# Patient Record
Sex: Female | Born: 1935 | Race: White | Hispanic: No | Smoking: Never smoker
Health system: Southern US, Community
[De-identification: ages and names within clinical notes are randomized; demographics above are authoritative.]

## PROBLEM LIST (undated history)

## (undated) DIAGNOSIS — J449 Chronic obstructive pulmonary disease, unspecified: Secondary | ICD-10-CM

## (undated) DIAGNOSIS — I2510 Atherosclerotic heart disease of native coronary artery without angina pectoris: Secondary | ICD-10-CM

## (undated) DIAGNOSIS — Z720 Tobacco use: Secondary | ICD-10-CM

## (undated) DIAGNOSIS — I10 Essential (primary) hypertension: Secondary | ICD-10-CM

## (undated) DIAGNOSIS — I739 Peripheral vascular disease, unspecified: Secondary | ICD-10-CM

## (undated) DIAGNOSIS — E119 Type 2 diabetes mellitus without complications: Secondary | ICD-10-CM

## (undated) DIAGNOSIS — E785 Hyperlipidemia, unspecified: Secondary | ICD-10-CM

## (undated) DIAGNOSIS — F0390 Unspecified dementia without behavioral disturbance: Secondary | ICD-10-CM

## (undated) DIAGNOSIS — I639 Cerebral infarction, unspecified: Secondary | ICD-10-CM

## (undated) HISTORY — DX: Atherosclerotic heart disease of native coronary artery without angina pectoris: I25.10

## (undated) HISTORY — DX: Cerebral infarction, unspecified: I63.9

## (undated) HISTORY — DX: Essential (primary) hypertension: I10

## (undated) HISTORY — DX: Peripheral vascular disease, unspecified: I73.9

## (undated) HISTORY — DX: Tobacco use: Z72.0

## (undated) HISTORY — DX: Hyperlipidemia, unspecified: E78.5

## (undated) HISTORY — DX: Type 2 diabetes mellitus without complications: E11.9

---

## 2004-04-29 ENCOUNTER — Inpatient Hospital Stay (HOSPITAL_BASED_OUTPATIENT_CLINIC_OR_DEPARTMENT_OTHER): Admission: RE | Admit: 2004-04-29 | Discharge: 2004-04-29 | Payer: Self-pay | Admitting: *Deleted

## 2004-05-06 ENCOUNTER — Ambulatory Visit (HOSPITAL_COMMUNITY): Admission: RE | Admit: 2004-05-06 | Discharge: 2004-05-07 | Payer: Self-pay | Admitting: Cardiology

## 2004-08-17 ENCOUNTER — Observation Stay (HOSPITAL_COMMUNITY): Admission: EM | Admit: 2004-08-17 | Discharge: 2004-08-19 | Payer: Self-pay | Admitting: Cardiology

## 2005-03-17 ENCOUNTER — Ambulatory Visit: Payer: Self-pay | Admitting: Cardiology

## 2006-04-24 ENCOUNTER — Ambulatory Visit: Payer: Self-pay | Admitting: Cardiology

## 2007-02-12 ENCOUNTER — Ambulatory Visit: Payer: Self-pay | Admitting: Cardiology

## 2007-05-02 ENCOUNTER — Ambulatory Visit: Payer: Self-pay | Admitting: Cardiology

## 2007-05-11 ENCOUNTER — Encounter: Payer: Self-pay | Admitting: Cardiology

## 2007-05-11 ENCOUNTER — Ambulatory Visit: Payer: Self-pay | Admitting: Cardiology

## 2007-05-23 ENCOUNTER — Ambulatory Visit: Payer: Self-pay | Admitting: Cardiology

## 2007-11-27 ENCOUNTER — Ambulatory Visit: Payer: Self-pay | Admitting: Cardiology

## 2007-12-04 ENCOUNTER — Ambulatory Visit: Payer: Self-pay | Admitting: Cardiology

## 2008-06-04 ENCOUNTER — Ambulatory Visit: Payer: Self-pay | Admitting: Cardiology

## 2008-12-19 ENCOUNTER — Ambulatory Visit: Payer: Self-pay | Admitting: Internal Medicine

## 2008-12-19 ENCOUNTER — Encounter: Payer: Self-pay | Admitting: Cardiology

## 2008-12-19 ENCOUNTER — Inpatient Hospital Stay (HOSPITAL_COMMUNITY): Admission: EM | Admit: 2008-12-19 | Discharge: 2008-12-28 | Payer: Self-pay | Admitting: Internal Medicine

## 2008-12-19 ENCOUNTER — Encounter: Payer: Self-pay | Admitting: Internal Medicine

## 2009-01-07 ENCOUNTER — Encounter: Payer: Self-pay | Admitting: Physician Assistant

## 2009-01-07 ENCOUNTER — Ambulatory Visit: Payer: Self-pay | Admitting: Cardiology

## 2009-01-08 ENCOUNTER — Encounter: Payer: Self-pay | Admitting: Physician Assistant

## 2009-02-12 ENCOUNTER — Ambulatory Visit: Payer: Self-pay | Admitting: Cardiology

## 2009-02-20 ENCOUNTER — Encounter: Payer: Self-pay | Admitting: Cardiology

## 2009-03-19 ENCOUNTER — Ambulatory Visit: Payer: Self-pay | Admitting: *Deleted

## 2009-05-03 ENCOUNTER — Encounter: Payer: Self-pay | Admitting: Cardiology

## 2009-05-04 ENCOUNTER — Ambulatory Visit: Payer: Self-pay | Admitting: Cardiology

## 2009-05-04 ENCOUNTER — Inpatient Hospital Stay (HOSPITAL_COMMUNITY): Admission: AD | Admit: 2009-05-04 | Discharge: 2009-05-05 | Payer: Self-pay | Admitting: Cardiology

## 2009-05-04 ENCOUNTER — Encounter: Payer: Self-pay | Admitting: Cardiology

## 2009-05-05 ENCOUNTER — Encounter: Payer: Self-pay | Admitting: Cardiology

## 2009-05-27 ENCOUNTER — Ambulatory Visit: Payer: Self-pay | Admitting: Cardiology

## 2009-09-10 ENCOUNTER — Ambulatory Visit: Payer: Self-pay | Admitting: Vascular Surgery

## 2009-11-23 ENCOUNTER — Ambulatory Visit: Payer: Self-pay | Admitting: Cardiology

## 2009-11-23 ENCOUNTER — Encounter: Payer: Self-pay | Admitting: Physician Assistant

## 2009-11-24 ENCOUNTER — Encounter: Payer: Self-pay | Admitting: Cardiology

## 2009-11-25 ENCOUNTER — Encounter: Payer: Self-pay | Admitting: Cardiology

## 2010-02-01 ENCOUNTER — Ambulatory Visit: Payer: Self-pay | Admitting: Cardiology

## 2010-02-01 DIAGNOSIS — I2510 Atherosclerotic heart disease of native coronary artery without angina pectoris: Secondary | ICD-10-CM | POA: Insufficient documentation

## 2010-02-01 DIAGNOSIS — J449 Chronic obstructive pulmonary disease, unspecified: Secondary | ICD-10-CM

## 2010-02-01 DIAGNOSIS — E785 Hyperlipidemia, unspecified: Secondary | ICD-10-CM

## 2010-02-01 DIAGNOSIS — I10 Essential (primary) hypertension: Secondary | ICD-10-CM

## 2010-02-01 DIAGNOSIS — I6529 Occlusion and stenosis of unspecified carotid artery: Secondary | ICD-10-CM

## 2010-02-01 DIAGNOSIS — E119 Type 2 diabetes mellitus without complications: Secondary | ICD-10-CM

## 2010-02-01 DIAGNOSIS — J4489 Other specified chronic obstructive pulmonary disease: Secondary | ICD-10-CM | POA: Insufficient documentation

## 2010-02-05 ENCOUNTER — Encounter: Payer: Self-pay | Admitting: Cardiology

## 2010-10-15 ENCOUNTER — Ambulatory Visit: Payer: Self-pay | Admitting: Cardiology

## 2011-01-18 ENCOUNTER — Encounter: Payer: Self-pay | Admitting: Cardiology

## 2011-01-25 NOTE — Cardiovascular Report (Signed)
Summary: Cardiac Catheterization  Cardiac Catheterization   Imported By: Dorise Hiss 10/15/2010 09:47:00  _____________________________________________________________________  External Attachment:    Type:   Image     Comment:   External Document

## 2011-01-25 NOTE — Assessment & Plan Note (Signed)
Summary: 6 mo fu per dec reminder-srs  Medications Added ASPIRIN EC 325 MG TBEC (ASPIRIN) Take one tablet by mouth daily METOPROLOL TARTRATE 25 MG TABS (METOPROLOL TARTRATE) Take 1/2 tablet by mouth twice a day NITROSTAT 0.4 MG SUBL (NITROGLYCERIN) use as directed      Allergies Added: ! PCN ! LIPITOR (ATORVASTATIN)  Visit Type:  Follow-up Primary Provider:  Dr. Donzetta Sprung, MD  CC:  follow-up visit.  History of Present Illness: the patient is a 77 year old female who is her coronary artery disease. Last catheterization was in 2010 she had patent stents and normal LV function.the patient also has significant carotid artery disease. She was hospitalized in November with recurrent atypical chest pain. The patient ruled out for myocardial infarction. Her ejection fraction was stable at 60%.  The patient states that she is doing well. She reports no recurrent chest pain either at rest or on exertion. She has no shortness of breath orthopnea PND. She does have occasional dizzy episodes, with what she calls a blanking episode. This however no fall and no definite loss of consciousness. The patient reports no palpitations.   Preventive Screening-Counseling & Management  Alcohol-Tobacco     Smoking Status: quit     Year Quit: 1985  Current Medications (verified): 1)  Ascriptin 325 Mg Tabs (Aspirin Buf(Alhyd-Mghyd-Cacar)) .... One Tablet By Mouth Daily 2)  Ramipril 5 Mg Caps (Ramipril) .... One Tablet By Mouth Twice A Day 3)  Glipizide 10 Mg Tabs (Glipizide) .... One Tablet By Mouth Daily 4)  Crestor 10 Mg Tabs (Rosuvastatin Calcium) .... One Tablet By Mouth Daily 5)  Celexa 20 Mg Tabs (Citalopram Hydrobromide) .... One Tablet By Mouth Daily 6)  Plavix 75 Mg Tabs (Clopidogrel Bisulfate) .... One Tablet By Mouth Daily 7)  Lasix 20 Mg Tabs (Furosemide) .... One Tablet By Mouth Daily 8)  Metoprolol Tartrate 25 Mg Tabs (Metoprolol Tartrate) .... Take 1/2 Tablet By Mouth Twice A Day 9)   Nitrostat 0.4 Mg Subl (Nitroglycerin) .... Use As Directed  Allergies (verified): 1)  ! Pcn 2)  ! Lipitor (Atorvastatin)  Comments:  Nurse/Medical Assistant: The patient's medications and allergies were reviewed with the patient and were updated in the Medication and Allergy Lists. List reviewed.  Past History:  Past Medical History: Last updated: 02/16/2009 multivessel coronary artery disease status post STEMI/VF cardiac arrestt treated with drug-eluting stenting of proximal LAD secondary to late stent thrombosis. Stage multilple drug- eluting stenting of RCA overall normal LVEF anterior apical akinesis. Relative hypotension improved following discontinuation of Corag peripheral artery disease ankle -- brachial indicis 0.78 right; 0.7 left April 2009 history of hypertension type 2 diabetes mellitus dyslipidemia intolerant to Lipitor remote tobacco history of stroke  Family History: Last updated: 02/16/2009 no pertinent family history  Social History: Last updated: 02/16/2009 Tobacco Use - No.  Alcohol Use - no Drug Use - no  Social History: Smoking Status:  quit  Review of Systems       The patient complains of dizziness.  The patient denies fatigue, malaise, fever, weight gain/loss, vision loss, decreased hearing, hoarseness, chest pain, palpitations, shortness of breath, prolonged cough, wheezing, sleep apnea, coughing up blood, abdominal pain, blood in stool, nausea, vomiting, diarrhea, heartburn, incontinence, blood in urine, muscle weakness, joint pain, leg swelling, rash, skin lesions, headache, fainting, depression, anxiety, enlarged lymph nodes, easy bruising or bleeding, and environmental allergies.    Vital Signs:  Patient profile:   77 year old female Height:      66 inches  Weight:      127 pounds BMI:     20.57 Pulse rate:   73 / minute BP sitting:   118 / 75  (left arm) Cuff size:   regular  Vitals Entered By: Carlye Grippe (February 01, 2010  9:46 AM) CC: follow-up visit   Physical Exam  Additional Exam:  General: Well-developed, well-nourished in no distress head: Normocephalic and atraumatic eyes PERRLA/EOMI intact, conjunctiva and lids normal nose: No deformity or lesions mouth normal dentition, normal posterior pharynx neck: Supple, no JVD.  No masses, thyromegaly or abnormal cervical nodes. Left carotid bruit lungs: Normal breath sounds bilaterally without wheezing.  Normal percussion heart: regular rate and rhythm with normal S1 and S2, no S3 or S4.  PMI is normal.  No pathological murmurs abdomen: Normal bowel sounds, abdomen is soft and nontender without masses, organomegaly or hernias noted.  No hepatosplenomegaly musculoskeletal: Back normal, normal gait muscle strength and tone normal pulsus: Pulse is normal in all 4 extremities Extremities: No peripheral pitting edema neurologic: Alert and oriented x 3 skin: Intact without lesions or rashes cervical nodes: No significant adenopathy psychologic: Normal affect    Impression & Recommendations:  Problem # 1:  CAD (ICD-414.00) the patient has a history of non-ST elevation myocardial infarction completed by ventricular fibrillation and treated with a drug-eluting stent to the proximal LAD secondary to late in-stent thrombosis in January 2009. The patient also had multiple drug-eluting stents in right coronary artery as well as circumflex coronary artery. Her last catheterization was in May of 2010 and demonstrated 50-70% distal circumflex stenosis, patent LAD stents and 50% distal RCA in-stent restenosis. Her ejection fraction was 60%. She reports no recurrent substernal chest pain. During her last admission a couple months ago she ruled out for myocardial infarction. We will continue her current medical regimen. The following medications were removed from the medication list:    Lopressor Hct 100-25 Mg Tabs (Metoprolol-hydrochlorothiazide) ..... One half tablet by  mouth twice a day Her updated medication list for this problem includes:    Aspirin Ec 325 Mg Tbec (Aspirin) .Marland Kitchen... Take one tablet by mouth daily    Ramipril 5 Mg Caps (Ramipril) ..... One tablet by mouth twice a day    Plavix 75 Mg Tabs (Clopidogrel bisulfate) ..... One tablet by mouth daily    Metoprolol Tartrate 25 Mg Tabs (Metoprolol tartrate) .Marland Kitchen... Take 1/2 tablet by mouth twice a day    Nitrostat 0.4 Mg Subl (Nitroglycerin) ..... Use as directed  Problem # 2:  CAROTID ARTERY DISEASE (ICD-433.10) the patient's significant bruit on exam. Her last carotid Doppler's were in February of 2010. The patient will be scheduled for followup Dopplers. Her updated medication list for this problem includes:    Aspirin Ec 325 Mg Tbec (Aspirin) .Marland Kitchen... Take one tablet by mouth daily    Plavix 75 Mg Tabs (Clopidogrel bisulfate) ..... One tablet by mouth daily  Problem # 3:  DYSLIPIDEMIA (ICD-272.4) the patient is intolerant to Lipitor but seems to be tolerating Crestor. Her lipid panels followed by primary care physician. Her updated medication list for this problem includes:    Crestor 10 Mg Tabs (Rosuvastatin calcium) ..... One tablet by mouth daily  Problem # 4:  ESSENTIAL HYPERTENSION, BENIGN (ICD-401.1) blood pressure is well controlled on current medical therapy and have made no changes. The following medications were removed from the medication list:    Lopressor Hct 100-25 Mg Tabs (Metoprolol-hydrochlorothiazide) ..... One half tablet by mouth twice a day Her  updated medication list for this problem includes:    Aspirin Ec 325 Mg Tbec (Aspirin) .Marland Kitchen... Take one tablet by mouth daily    Ramipril 5 Mg Caps (Ramipril) ..... One tablet by mouth twice a day    Lasix 20 Mg Tabs (Furosemide) ..... One tablet by mouth daily    Metoprolol Tartrate 25 Mg Tabs (Metoprolol tartrate) .Marland Kitchen... Take 1/2 tablet by mouth twice a day  Other Orders: Carotid Duplex (Carotid Duplex)  Patient Instructions: 1)  Your  physician has requested that you have a carotid duplex. This test is an ultrasound of the carotid arteries in your neck. It looks at blood flow through these arteries that supply the brain with blood. Allow one hour for this exam. There are no restrictions or special instructions. 2)  Your physician recommends that you continue on your current medications as directed. Please refer to the Current Medication list given to you today. 3)  Your physician wants you to follow-up in: 6 months. You will receive a reminder letter in the mail one-two months in advance. If you don't receive a letter, please call our office to schedule the follow-up appointment.

## 2011-01-25 NOTE — Assessment & Plan Note (Signed)
Summary: 6 MO FU PER SEPT REMINDER   Visit Type:  Follow-up Primary Provider:  Dr. Donzetta Sprung, MD   History of Present Illness: the patient has a history of non-ST elevation myocardial infarction complicated by ventricular fibrillation and treated with a drug-eluting stent to the proximal LAD secondary to late in-stent thrombosis in January 2009. The patient also had multiple drug-eluting stents in right coronary artery as well as circumflex coronary artery. Her last catheterization was in May of 2010 and demonstrated 50-70% distal circumflex stenosis, patent LAD stents and 50% distal RCA in-stent restenosis. Her ejection fraction was 60%. She reports no recurrent substernal chest pain  The patient had carotid dopplers since her last visit. She is a 50-69% stenosis on the right and less than 50% stenosis on the left.  She denies any chest pain shortness of breath orthopnea PND. She has done quite well. Dr. Garner Nash also  performed blood work. She does have some mild hypertension and have asked her daughter to make some readings   Preventive Screening-Counseling & Management  Alcohol-Tobacco     Smoking Status: quit     Year Quit: 1985  Current Medications (verified): 1)  Aspirin Ec 325 Mg Tbec (Aspirin) .... Take One Tablet By Mouth Daily 2)  Ramipril 5 Mg Caps (Ramipril) .... One Tablet By Mouth Twice A Day 3)  Glipizide 10 Mg Tabs (Glipizide) .... One Tablet By Mouth Daily 4)  Crestor 10 Mg Tabs (Rosuvastatin Calcium) .... One Tablet By Mouth Daily 5)  Celexa 20 Mg Tabs (Citalopram Hydrobromide) .... One Tablet By Mouth Daily 6)  Plavix 75 Mg Tabs (Clopidogrel Bisulfate) .... One Tablet By Mouth Daily 7)  Lasix 20 Mg Tabs (Furosemide) .... Take One By Mouth 3 Times Per Week 8)  Metoprolol Tartrate 25 Mg Tabs (Metoprolol Tartrate) .... Take 1/2 Tablet By Mouth Twice A Day 9)  Nitrostat 0.4 Mg Subl (Nitroglycerin) .... Use As Directed  Allergies (verified): 1)  ! Pcn 2)  ! Lipitor  (Atorvastatin)  Comments:  Nurse/Medical Assistant: The patient's medications and allergies were verbally reviewed with the patient's daughter and were updated in the Medication and Allergy Lists.  Past History:  Past Medical History: multivessel coronary artery disease status post STEMI/VF cardiac arrestt treated with drug-eluting stenting of proximal LAD secondary to late stent thrombosis. Stage multilple drug- eluting stenting of RCA overall normal LVEF anterior apical akinesis. Relative hypotension improved following discontinuation of Corag peripheral artery disease ankle -- brachial indicis 0.78 right; 0.7 left April 2009 history of hypertension type 2 diabetes mellitus dyslipidemia intolerant to Lipitor remote tobacco Carotid Dopplers February 2011 50-69% stenosis on the right and 50% stenosis on the left history of stroke  Review of Systems  The patient denies fatigue, malaise, fever, weight gain/loss, vision loss, decreased hearing, hoarseness, chest pain, palpitations, shortness of breath, prolonged cough, wheezing, sleep apnea, coughing up blood, abdominal pain, blood in stool, nausea, vomiting, diarrhea, heartburn, incontinence, blood in urine, muscle weakness, joint pain, leg swelling, rash, skin lesions, headache, fainting, dizziness, depression, anxiety, enlarged lymph nodes, easy bruising or bleeding, and environmental allergies.    Vital Signs:  Patient profile:   77 year old female Height:      66 inches Weight:      133 pounds Pulse rate:   60 / minute BP sitting:   167 / 73  (left arm) Cuff size:   regular  Vitals Entered By: Carlye Grippe (October 15, 2010 2:13 PM)  Physical Exam  Additional Exam:  General: Well-developed, well-nourished in no distress head: Normocephalic and atraumatic eyes PERRLA/EOMI intact, conjunctiva and lids normal nose: No deformity or lesions mouth normal dentition, normal posterior pharynx neck: Supple, no JVD.  No  masses, thyromegaly or abnormal cervical nodes. Left carotid bruit lungs: Normal breath sounds bilaterally without wheezing.  Normal percussion heart: regular rate and rhythm with normal S1 and S2, no S3 or S4.  PMI is normal.  No pathological murmurs abdomen: Normal bowel sounds, abdomen is soft and nontender without masses, organomegaly or hernias noted.  No hepatosplenomegaly musculoskeletal: Back normal, normal gait muscle strength and tone normal pulsus: Pulse is normal in all 4 extremities Extremities: No peripheral pitting edema neurologic: Alert and oriented x 3 skin: Intact without lesions or rashes cervical nodes: No significant adenopathy psychologic: Normal affect    Impression & Recommendations:  Problem # 1:  CAROTID ARTERY DISEASE (ICD-433.10) stable no further intervention required Her updated medication list for this problem includes:    Aspirin Ec 325 Mg Tbec (Aspirin) .Marland Kitchen... Take one tablet by mouth daily    Plavix 75 Mg Tabs (Clopidogrel bisulfate) ..... One tablet by mouth daily  Problem # 2:  CAD (ICD-414.00) History of  severe coronary disease with multiple prior stent placements she'll require lifelong aspirin and Plavix. Her updated medication list for this problem includes:    Aspirin Ec 325 Mg Tbec (Aspirin) .Marland Kitchen... Take one tablet by mouth daily    Ramipril 5 Mg Caps (Ramipril) ..... One tablet by mouth twice a day    Plavix 75 Mg Tabs (Clopidogrel bisulfate) ..... One tablet by mouth daily    Metoprolol Tartrate 25 Mg Tabs (Metoprolol tartrate) .Marland Kitchen... Take 1/2 tablet by mouth twice a day    Nitrostat 0.4 Mg Subl (Nitroglycerin) ..... Use as directed  Problem # 3:  ESSENTIAL HYPERTENSION, BENIGN (ICD-401.1) blood pressure is elevated. She may need increase in her medications and I would suggest to switch Lasix to daily chlorthalidone. However her daughter will first check  blood pressure readings and she will get back in touch with [place] Her updated medication  list for this problem includes:    Aspirin Ec 325 Mg Tbec (Aspirin) .Marland Kitchen... Take one tablet by mouth daily    Ramipril 5 Mg Caps (Ramipril) ..... One tablet by mouth twice a day    Lasix 20 Mg Tabs (Furosemide) .Marland Kitchen... Take one by mouth 3 times per week    Metoprolol Tartrate 25 Mg Tabs (Metoprolol tartrate) .Marland Kitchen... Take 1/2 tablet by mouth twice a day  Patient Instructions: 1)  Your physician recommends that you continue on your current medications as directed. Please refer to the Current Medication list given to you today. 2)  Follow up in  6 months

## 2011-01-25 NOTE — Letter (Signed)
Summary: [facility] D/C DR. Donzetta Sprung  [facility] D/C DR. Donzetta Sprung   Imported By: Zachary George 01/29/2010 15:46:25  _____________________________________________________________________  External Attachment:    Type:   Image     Comment:   External Document

## 2011-01-25 NOTE — Consult Note (Signed)
Summary: CARDIOLOGY CONSULT/ [facility]  CARDIOLOGY CONSULT/ [facility]   Imported By: Zachary George 01/29/2010 15:45:10  _____________________________________________________________________  External Attachment:    Type:   Image     Comment:   External Document

## 2011-02-02 NOTE — Letter (Signed)
Summary: External Correspondence/ [facility] VIS[facility]  External Correspondence/ [facility]   Imported By: Dorise Hiss 01/26/2011 09:52:54  _____________________________________________________________________  External Attachment:    Type:   Image     Comment:   External Document

## 2011-02-24 ENCOUNTER — Encounter: Payer: Self-pay | Admitting: Vascular Surgery

## 2011-02-24 ENCOUNTER — Other Ambulatory Visit: Payer: Self-pay

## 2011-04-05 LAB — CBC
HCT: 38.4 % (ref 36.0–46.0)
MCHC: 33.2 g/dL (ref 30.0–36.0)
Platelets: 149 10*3/uL — ABNORMAL LOW (ref 150–400)
RBC: 4.81 MIL/uL (ref 3.87–5.11)
WBC: 6.3 10*3/uL (ref 4.0–10.5)

## 2011-04-05 LAB — GLUCOSE, CAPILLARY
Glucose-Capillary: 195 mg/dL — ABNORMAL HIGH (ref 70–99)
Glucose-Capillary: 65 mg/dL — ABNORMAL LOW (ref 70–99)

## 2011-04-05 LAB — BASIC METABOLIC PANEL
CO2: 35 mEq/L — ABNORMAL HIGH (ref 19–32)
Chloride: 100 mEq/L (ref 96–112)
Creatinine, Ser: 0.64 mg/dL (ref 0.4–1.2)
Potassium: 3.9 mEq/L (ref 3.5–5.1)
Sodium: 140 mEq/L (ref 135–145)

## 2011-04-05 LAB — PROTIME-INR
INR: 1 (ref 0.00–1.49)
Prothrombin Time: 13.8 seconds (ref 11.6–15.2)

## 2011-04-05 LAB — HEPARIN LEVEL (UNFRACTIONATED)
Heparin Unfractionated: 0.41 IU/mL (ref 0.30–0.70)
Heparin Unfractionated: 0.72 IU/mL — ABNORMAL HIGH (ref 0.30–0.70)

## 2011-04-05 LAB — LIPID PANEL: VLDL: 16 mg/dL (ref 0–40)

## 2011-04-11 LAB — GLUCOSE, CAPILLARY
Glucose-Capillary: 124 mg/dL — ABNORMAL HIGH (ref 70–99)
Glucose-Capillary: 212 mg/dL — ABNORMAL HIGH (ref 70–99)
Glucose-Capillary: 87 mg/dL (ref 70–99)

## 2011-04-11 LAB — BASIC METABOLIC PANEL
CO2: 39 mEq/L — ABNORMAL HIGH (ref 19–32)
Chloride: 93 mEq/L — ABNORMAL LOW (ref 96–112)
GFR calc Af Amer: >60 mL/min (ref >60)
GFR calc non Af Amer: >60 mL/min (ref >60)
Glucose, Bld: 138 mg/dL — ABNORMAL HIGH (ref 70–99)
Potassium: 3.8 mEq/L (ref 3.5–5.1)
Sodium: 138 mEq/L (ref 135–145)
Sodium: 139 mEq/L (ref 135–145)

## 2011-04-11 LAB — CBC
HCT: 37.5 % (ref 36.0–46.0)
Hemoglobin: 11.9 g/dL — ABNORMAL LOW (ref 12.0–15.0)
RBC: 4.59 MIL/uL (ref 3.87–5.11)
RDW: 13.6 % (ref 11.5–15.5)

## 2011-05-10 NOTE — Procedures (Signed)
CAROTID DUPLEX EXAM   INDICATION:  Carotid disease.   HISTORY:  Diabetes:  Yes.  Cardiac:  MI, CAD.  Hypertension:  Yes.  Smoking:  Previous.  Previous Surgery:  No.  CV History:  History of stroke, currently asymptomatic.  Amaurosis Fugax No, Paresthesias No, Hemiparesis No                                       RIGHT             LEFT  Brachial systolic pressure:         172               158  Brachial Doppler waveforms:         Normal            Normal  Vertebral direction of flow:        Antegrade         Antegrade  DUPLEX VELOCITIES (cm/sec)  CCA peak systolic                   87                142  ECA peak systolic                   118               192  ICA peak systolic                   139               133  ICA end diastolic                   35                31  PLAQUE MORPHOLOGY:                  Mixed             Mixed  PLAQUE AMOUNT:                      Moderate          Moderate  PLAQUE LOCATION:                    ICA/ECA           ICA/ECA/CCA   IMPRESSION:  A 40% to 59% stenosis of the bilateral internal carotid  artery.   ___________________________________________  Di Kindle. Edilia Bo, M.D.   CH/MEDQ  D:  09/10/2009  T:  09/10/2009  Job:  [id number]

## 2011-05-10 NOTE — Cardiovascular Report (Signed)
[name], [name] NO.:  [id number]   MEDICAL RECORD NO.:  [id number]          PATIENT TYPE:  INP   LOCATION:  2914                         FACILITY:  [facility]   PHYSICIAN:  Veverly Fells. Excell Seltzer, MD  DATE OF BIRTH:  October 06, 1933   DATE OF PROCEDURE:  12/22/2008  DATE OF DISCHARGE:                            CARDIAC CATHETERIZATION   PROCEDURE:  Percutaneous transluminal coronary angioplasty and stenting  of the mid and distal right coronary artery.   INDICATIONS:  [name] is a 77 year old woman who presented with a  cardiac arrest.  She developed substernal chest pain and ventricular  fibrillation on December 25.  She was found to have critical restenosis  in the mid LAD which was considered her culprit vessel.  At the time of  her initial PCI procedure, she was noted to have severe stenosis in the  proximal to mid right coronary artery and was referred today for staged  PCI.   Risks and indications of the procedure were reviewed with the patient  and informed consent was obtained.  The right groin was prepped, draped,  and anesthetized with 1% lidocaine.  Using modified Seldinger technique,  a 6-French sheath was placed in the right femoral artery.  Angiomax was  started for anticoagulation.  The patient had been adequately preloaded  with clopidogrel 600 mg the day of her initial procedure and then had  received 75 mg daily.  Once a therapeutic ACT was achieved, a 6-French  JR-4 guide catheter was inserted into the right coronary artery.  This  demonstrated severe calcific stenosis at the junction of the proximal to  mid right coronary artery with a 90% lesion.  There was a tandem  moderate lesion beyond that area.  Further down in the vessel at the  junction of the mid to distal right coronary artery, there was another  70% lesion.  I had initially planned on treating the severe stenosis  with PCI and leaving the other lesions for medical therapy.  A Cougar  guidewire was advanced easily into the distal vessel beyond the area of  disease.  The vessel was predilated with a 2.5 x 15 mm Sprinter balloon  which was taken to 8 atmospheres.  Following predilatation, I elected to  stent the severe lesion with a 3.0 x 18 mm Endeavor stent.  The stent  was carefully positioned and deployed at 12 atmospheres.  It appeared  well expanded.  The result was good, but the tandem lesion appeared  severely stenosed beyond the stented segment.  Some of this was clearly  due to wire bias.  Therefore, I gave intracoronary nitroglycerin  following post-dilatation of the original stent.  The stent was post-  dilated with a 3.0 x 15 mm Voyager [place] balloon which was taken to 14  atmospheres originally.  After intracoronary nitroglycerin, the wire was  pulled back to eliminate wire bias.  Angiography demonstrated  significant disease of the distal end of the stent and the other lesion  at the junction of the mid to distal vessel also appeared more  significant following nitroglycerin.  I elected to treat both of these  areas.  Attention was first turned to the distal lesion and a 2.5 x 18  mm Endeavor stent was positioned and deployed at 12 atmospheres.  The  stent appeared well expanded.  Attention was then turned to the mid  lesion just off the distal end of the original stent.  A 3.0 x 12 mm  Endeavor stent was positioned and deployed also at 12 atmospheres.  Following stenting of the mid and distal vessel, there was an excellent  angiographic result.  The mid segment was post-dilated with the same 3.0  x 15 mm Voyager [place] balloon which was taken to 16 atmospheres on 2  inflations over the overlapped area into the distal stent.  The most  distal stent was then post-dilated with a 2.75 x 15-mm Voyager [place]  balloon which was taken to 16 atmospheres.  At the completion of the  procedure, there was an excellent angiographic result.  All stents  appeared well deployed.   There was TIMI III flow in the vessel.   ASSESSMENT:  Successful percutaneous coronary intervention of the right  coronary artery with multiple drug-eluting stents.   PLAN:  Recommend aspirin and Plavix for minimum of 12 months.      Veverly Fells. Excell Seltzer, MD  Electronically Signed     MDC/MEDQ  D:  12/22/2008  T:  12/22/2008  Job:  [id number]

## 2011-05-10 NOTE — H&P (Signed)
NAMELAYZA, SUMMA NO.:  [id number]   MEDICAL RECORD NO.:  [id number]          PATIENT TYPE:  INP   LOCATION:  2915                         FACILITY:  [facility]   PHYSICIAN:  Bevelyn Buckles. Bensimhon, MDDATE OF BIRTH:  08/24/1933   DATE OF ADMISSION:  12/19/2008  DATE OF DISCHARGE:                              HISTORY & PHYSICAL   PRIMARY CARE PHYSICIAN:  Dr. Donzetta Sprung.   Cardiologist is Dr. Lewayne Bunting.   HISTORY OF PRESENT ILLNESS:  Ms. Brierley is a 77 year old woman with a  history of coronary artery disease, diabetes, hypertension and recent  melanoma resection with a nonhealing ulcer on her right lower extremity.  She has a history of coronary artery disease.  She is status post  lateral wall myocardial infarction in 1999 and subsequently underwent  left circumflex stenting.  In May 2005 she had recurrent chest pain.  Cardiac catheterization showed left main was okay. LAD had a high-grade  proximal lesion which was stented by a 2.5x23-mm Cypher drug-eluting  stent. The left circumflex stent was patent. The RCA had a 70% distal  lesion, and the EF was 55%.  She did have a follow-up cath in August  2005, recurrent chest pain.  The LAD stent was widely patent.  There  were multiple scattered lesions in the RCA and the left circumflex  including a 70% distal in-stent restenosis.  She was treated medically.   She has been doing reasonably well.  However, she has had a recent  melanoma resection with nonhealing ulcer on right lower extremity.  Tonight at about 9:00 p.m. she had chest pain.  Apparently she called  EMS around 3:00 a.m. Initial EKG showed some lateral ST elevation.  While in the ambulance she had a VF arrest.  She was intubated and  started on amiodarone, and CPR was initiated. She was diverted to  [facility]. She was revised.  She was stabilized. Code STEMI was  reactivated and transported here. She arrives in our cath lab at about  5:10 a.m.  She was awake intubated.  She was complaining of chest pain.  EKG showed no obvious ST elevation.   REVIEW OF SYSTEMS:  Not available given the fact that she is intubated.   SOCIAL HISTORY:  She is retired.  She lives in [place]. She currently  does not smoke.  She does have a previous history of smoking.  No  alcohol.   FAMILY HISTORY:  Not available.   PAST MEDICAL HISTORY:  1. Coronary artery disease as described above.  2. Peripheral arterial disease.      a.     ABIs in April 2009 were 0.7 in the right and 0.7 on the       left.  3. Hypertension.  4. Diabetes.  5. Hyperlipidemia, intolerant to statins.  6. History of CVA.  7. Chronic dyspnea.   CURRENT MEDICATIONS:  All taste 5 mg a day, amlodipine 5 mg a day,  metoprolol 25 mg b.i.d., Bayer aspirin 81 mg a day, vitamin D, and Zetia  10 mg a day.  She also takes  glipizide 10 mg a day.   ALLERGIES:  To PENICILLIN.   PHYSICAL EXAM:  GENERAL APPEARANCE:  She is intubated but awake.  VITAL SIGNS:  Blood pressure 107/64, heart rate is 65.  HEENT: She has endotracheal tube. She has some foaming around the tube.  NECK: Supple.  There is mild JVD.  Carotids are 1+ bilaterally without  any audible bruits.  There is no lymphadenopathy or thyromegaly.  CHEST:  PMI is not palpable.  She has very distant heart sounds.  She is  regular.  No obvious murmurs.  LUNGS:  Mild rhonchi.  ABDOMEN: Soft, nontender, nondistended.  No hepatosplenomegaly, no  bruits or masses.  Femoral pulses are 2+ bilaterally.  Distal pulses are  nonpalpable.  There is no cyanosis, clubbing or edema.  She does have  1.5x1.5-cm nonhealing ulcer on the right shin.  NEURO:  She is a awake and follows commands.   Labs are pending.   ASSESSMENT:  1. Apparent lateral wall ST elevation and myocardial infarction      complicated by ventricular fibrillation arrest.  2. Ventilatory dependent respiratory failure.  3. Diabetes.  4. Coronary arteries disease as  described above status post previous      left anterior descending artery and circumflex stents.  5. Hyperlipidemia, intolerant of statins.   PLAN/DISPOSITION:  She is undergoing emergent cardiac catheterization by  Dr. Katrinka Blazing.  Plans are pending the results of her catheterization.  Pulmonary critical care team is aware.      Bevelyn Buckles. Bensimhon, MD  Electronically Signed     DRB/MEDQ  D:  12/19/2008  T:  12/19/2008  Job:  [id number]

## 2011-05-10 NOTE — Assessment & Plan Note (Signed)
[facility]                          [name]   [name], [name]                       MRN:          [id number]  DATE:05/27/2009                            DOB:          June 27, 1933    REFERRING PHYSICIAN:  Donzetta Sprung   HISTORY OF PRESENT ILLNESS:  The patient is a 77 year old female with a  history of known coronary artery disease status post cardiac arrest and  ventricular fibrillation in the setting of late stent thrombosis of the  LAD last year.  The patient underwent staged intervention to the right  coronary artery.  Recently, the patient was seen on May 04, 2009, in the  hospital for recurrent chest pain.  She was referred for repeat cardiac  catheterization.  The patient was found less than 10% stenosis within  the Xience stent in the proximal left anterior descending artery, 40%  narrowing in the near stent in the mid circumflex, and 60-70% narrowing  in the distal circumflex distal to the stent, 30% narrowing within the  endeavor stent in the proximal right coronary artery, 40% narrowing  within the stent in the mid right coronary artery, and 50% narrowing  within the endeavors stent in the distal right coronary artery.  The LV  function was normal at 60%.  Since that hospital admission, the patient  has had no recurrent substernal chest pain.  She feels like she is doing  well.  She denies any short of breath, orthopnea, or PND.  She also  denies any syncope.  She is compliant with her medical regimen.   MEDICATIONS:  1. Aspirin 325 mg p.o. daily.  2. Ramipril 5 mg p.o. b.i.d.  3. Glipizide 10 mg p.o. daily.  4. Lopressor 25 mg p.o. b.i.d.  5. Crestor 10 mg p.o. daily.  6. Plavix 75 mg p.o. daily.  7. Lasix 20 mg p.o. daily.  8. Celexa 20 mg half tablet p.o. at bedtime.   PHYSICAL EXAMINATION:  VITAL SIGNS:  Blood pressure 109/58, heart rate  59, weight 135 pounds.  NECK:  Normal carotid upstroke with left  carotid bruit.  Normal carotid  upstroke bilaterally.  No thyromegaly.  No nodular thyroid.  LUNGS:  Clear breath sounds bilaterally.  HEART:  Regular rate and rhythm with normal S1 and S2.  No murmurs,  rubs, or gallops.  ABDOMEN:  Soft, nontender.  No rebound or guarding.  Good bowel sounds.  EXTREMITIES:  No cyanosis, clubbing, or edema.  NEUROLOGIC:  The patient is alert and oriented, grossly nonfocal.   PROBLEM LIST:  1. Multivessel coronary artery disease.      a.     Status post ST-segment elevation, myocardial       infarction/ventricular fibrillation.  Treated with drug-eluting       stents of the proximal LAD and right coronary artery.  2. Multi drug-eluting stenting of the right coronary artery.  3. Overall normal left ventricular function.  4. Anterior apical hypokinesis.      a.     Normal left ventricular function.  5. Peripheral vascular disease.  a.     Ankle and brachial index 0.78 on the right and 0.7 on the       left.      b.     50-70% stenosis in both carotid arteries.  6. History of hypertension.  7. Type 2 diabetes mellitus.  8. Dyslipidemia, intolerance to LIPITOR.  9. Remote tobacco use.  10.History of stroke.   PLAN:  1. The patient is doing well from a cardiac standpoint.  She will need      to stay on lifelong aspirin and Plavix.  2. The patient will need followup with carotid Dopplers during the      next clinic visit.  3. The patient's cholesterol is being checked by a primary care      physician, Dr. Reuel Boom and we will make no changes.  4. I did advise the patient to go up to a normal dose of Celexa which      is 20 mg at bedtime.     Learta Codding, MD,FACC  Electronically Signed    GED/MedQ  DD: 05/27/2009  DT: 05/28/2009  Job #: [id number]   cc:   Donzetta Sprung

## 2011-05-10 NOTE — Assessment & Plan Note (Signed)
[facility]                          [name]   [name], [name]                       MRN:          [id number]  DATE:06/04/2008                            DOB:          1933/11/23    PRIMARY CARDIOLOGIST:  Learta Codding, MD, [facility]   PRIMARY CARE PHYSICIAN:  Dr. Donzetta Sprung.   REASON FOR VISIT:  Six-month followup.   HISTORY OF PRESENT ILLNESS:  [name] is a very pleasant 77 year old  female patient with a history of CAD, status post myocardial infarction  in 1999 and subsequent stenting with a Cypher stent to the LAD in May  2005, who presents to the office today for followup.  Overall, she is  doing well.  She did have her Norvasc changed to 5 mg daily at her last  visit.  She has noted increasing ankle edema since that time.  She has  another lesion on her leg that was suspicious for melanoma.  She is  followed by Dr. Cleotis Nipper.  Apparently, she was told that this would not  be removed unless it changed due to arterial insufficiency.  She did  have ABIs performed that were 0.78 on the right and 0.79 on the left.  She denies any significant leg pain.  She has no evidence of nonhealing  ulcers on her legs or feet.  She denies any chest discomfort.  She does  have some shortness breath with exertion.  She describes NYHA class II  symptoms.  She denies orthopnea, PND, or pedal edema.  She denies  significant cough or wheezing.  She denies syncope.   CURRENT MEDICATIONS:  1. Glipizide 10 mg daily.  2. Metoprolol 25 mg b.i.d.  3. Altace 2.5 mg b.i.d.  4. Zetia 10 mg 3 times a week.  5. Norvasc 5 mg daily.  6. Aspirin 81 mg daily.  7. Nitroglycerin p.r.n. chest pain.  8. Combivent p.r.n.   PHYSICAL EXAM:  GENERAL:  She is a well-nourished, well-developed  female.  VITAL SIGNS:  Blood pressure 135/75, pulse 60, weight 146.6 pounds.  HEENT:  Normal.  NECK:  Without JVD.  LYMPH:  Without lymphadenopathy.  CARDIAC:   Normal S1 and S2.  Regular rate and rhythm.  No murmur.  LUNGS:  Clear to auscultation bilaterally.  No wheezing.  No rhonchi.  No rales.  ABDOMEN:  Soft and nontender with normoactive bowel sounds.  No  organomegaly.  EXTREMITIES:  With 2+ ankle edema bilaterally.  NEUROLOGIC:  She is alert and oriented x3.  Cranial nerves II-XII  grossly intact.  VASCULAR:  No carotid artery bruits noted bilaterally.  Dorsalis pedis  pulses are 1+ bilaterally.  SKIN:  Warm and dry.   Electrocardiogram reveals sinus rhythm.  The heart rate is 62.  Normal  axis.  No acute changes.   IMPRESSION:  1. Coronary artery disease.      a.     Nonischemic Cardiolite, May 2008, with an ejection fraction       of 71%.      b.     Status post Cypher stent to  the left anterior descending,       May 2005.      c.     Cardiac catheterization, August 2005 - patent left anterior       descending stent, nonobstructive circumflex stent, and 70-80%       posterior lateral branch stenosis.      d.     History of myocardial infarction with stenting to the       circumflex 1999.  2. Peripheral arterial disease.      a.     Ankle-brachial indices, April 2009; 0.78 on the right, 0.7       on the left.  3. Pedal edema.      a.     Secondary to Norvasc.  4. Hypertension.  5. Diabetes mellitus.  6. Dyslipidemia.      a.     Intolerant to statins.  7. History of cerebrovascular accident.  8. Ex-smoker.  9. Dyspnea.   PLAN:  1. [name] presents for followup.  She has had increasing pedal      edema since increasing the Norvasc dose.  I have asked to cut this      back to 2.5 mg daily.  She is to notify us if she has no resolution      in her edema.  2. She has had some dyspnea.  She does have a significant past history      of smoking.  She does have COPD and she does have evidence of      hyperinflation on her previous chest x-ray.  She is to follow up      further with Dr. Reuel Boom for this.  3. The patient has a  goal blood pressure of less than 130/80 given her      diabetes mellitus.  Since we are decreasing her Norvasc, I have      asked her to increase her Altace to 5 mg twice a day.  She should      monitor blood pressures at home.  If she has low blood pressures or      side effects to this, she should decrease back to 2.5 mg twice a      day and follow up further with Dr. Reuel Boom.  4. We will recheck her B-MET in 1 week's time to follow up on renal      function and potassium.  At that time we will check a BNP given her      history of dyspnea and ankle edema.  If her BNP is significantly      abnormal, we will need to arrange an echocardiogram to ensure that      she continues to have normal LV function.  5. The patient has abnormal ankle-brachial indices and evidence of      peripheral arterial disease.  She has no significant leg pain, and      she has no evidence of nonhealing ulcers.  No further workup is      warranted at this time.  I discussed this with the patient and her      daughter today.  6. The patient will follow up in 6 months or sooner p.r.n.      Tereso Newcomer, PA-C  Electronically Signed      Learta Codding, MD,FACC  Electronically Signed   SW/MedQ  DD: 06/04/2008  DT: 06/05/2008  Job #: [id number]   cc:   Donzetta Sprung

## 2011-05-10 NOTE — Consult Note (Signed)
VASCULAR SURGERY CONSULTATION   [name], [name]  DOB:  1933/04/26                                       03/19/2009  [id number]#:[id number]   PRIMARY CARE PHYSICIAN:  Donzetta Sprung, MD.   REFERRAL DIAGNOSIS:  Bilateral carotid stenosis.   HISTORY:  The patient is a 77 year old female with a history of known  coronary artery disease who has suffered a cardiac arrest associated  with ventricular fibrillation.   She has a history of carotid bruit.  Carotid duplex exam carried out  February 20, 2009, reveals moderate bilateral internal carotid artery  stenosis in the 40% to 60% range.   The patient has a remote history of nondisabling stroke.  Denies recent  visual disturbance, sensory or motor deficit.  No gait abnormality.  No  speech problems.   Risk factors for cerebrovascular disease include coronary artery  disease, type 2 diabetes, hypertension and hyperlipidemia.   PAST MEDICAL HISTORY:  1. Type 2 diabetes.  2. Hypertension.  3. Coronary artery disease status post percutaneous intervention.  4. Hyperlipidemia.  5. COPD.  6. Peripheral vascular disease.  7. Melanoma.   MEDICATIONS:  1. ASA 325 mg daily.  2. Altace 5 mg 2 times daily.  3. Glipizide 10 mg daily.  4. Metoprolol 12.5 mg 2 times daily.  5. Crestor 10 mg daily.  6. Celexa 20 mg daily.  7. Plavix 75 mg daily.  8. Lasix 20 mg daily.  9. Nitroglycerin p.r.n.  10.Vitamin D.   ALLERGIES:  Penicillin and Lipitor.   SOCIAL HISTORY:  The patient is widowed with 3 children.  She is retired  from the [employer].  Does not currently smoke.  Discontinued tobacco  use in 1989.  No regular alcohol use.   FAMILY HISTORY:  Mother deceased, age 56, of cancer.  Father deceased in  an accident.  One brother deceased with a history of coronary artery  disease at age 82.  One sister deceased with a history of leukemia at  25.   REVIEW OF SYSTEMS:  Refer to patient encounter form.  The patient's only  complaint is that of feelings of depression.   PHYSICAL EXAM:  A 77 year old female who appears approximately her  stated age.  Alert and oriented.  No distress.  Vital Signs:  BP 161/82  left arm, 183/77 right arm, pulse is 64 per minute.  HEENT:  Unremarkable.  Neck:  Supple, no thyromegaly or adenopathy.  Chest:  Equal air entry bilaterally, no rales or rhonchi.  Cardiovascular:  Soft  bilateral carotid bruits.  Normal heart sounds without murmurs.  Regular  rate and rhythm.  No gallops or rubs.  Abdomen:  Soft, nontender.  No  guarding or rebound.  Normal bowel sounds without bruits.  No masses or  organomegaly.  Extremities:  Full range of motion intact.  No peripheral  edema.  Neurologic:  Cranial nerves intact.  Strength equal bilaterally.  Reflexes 1+.  Skin:  Intact, without rash or ulceration.   IMPRESSION:  1. Asymptomatic moderate bilateral internal carotid artery stenosis.  2. Coronary artery disease.  3. Hypertension.  4. Hyperlipidemia.  5. Type 2 diabetes.  6. Peripheral vascular disease.  7. Melanoma.   RECOMMENDATIONS:  The patient has asymptomatic bilateral internal  carotid artery stenoses.  Recommend follow-up with serial ultrasound in  6 months.  Continue aspirin and  Plavix.   No intervention for chronic medical conditions at this time.   Balinda Quails, M.D.  Electronically Signed  PGH/MEDQ  D:  03/19/2009  T:  03/20/2009  Job:  [id number]   cc:   Noah Charon, MD,FACC

## 2011-05-10 NOTE — Discharge Summary (Signed)
[name], [name] NO.:  [id number]   MEDICAL RECORD NO.:  [id number]          PATIENT TYPE:  INP   LOCATION:  2028                         FACILITY:  [facility]   PHYSICIAN:  Bevelyn Buckles. Bensimhon, MDDATE OF BIRTH:  Feb 03, 1933   DATE OF ADMISSION:  12/19/2008  DATE OF DISCHARGE:  12/28/2008                               DISCHARGE SUMMARY   PRIMARY CARDIOLOGIST:  Learta Codding, MD, [facility]   PRIMARY CARE PHYSICIAN:  Dr. Donzetta Sprung.   PROCEDURES PERFORMED DURING HOSPITALIZATION:  1. Emergent cardiac catheterization status post VFib arrest, December 19, 2008.      a.     VFib presumably due to spontaneous recannulization of LAD       after late stent thrombosis, successful angioplasty, and       restenting of the proximal LAD with a XIENCE V drug-eluting stent,       moderate in-stent restenosis in the circumflex stent, new high-       grade proximal right coronary artery stenosis, left ventricular       dysfunction with anterior apical akinesis that will likely       improve.  Overall, LV function is normal, LVDEP is normal per Dr.       Verdis Prime.  2. PTCA of the right coronary artery per Dr. Tonny Bollman on      December 22, 2008, with three overlapping endeavor drug-eluting      stents to the right coronary artery.   FINAL DISCHARGE DIAGNOSES:  1. Status post ventricular fibrillation arrest in the setting of known      coronary artery disease.      a.     ST elevated myocardial infarction.      b.     Status post spontaneous recannulization of left anterior       descending (coronary artery) after late stent thrombosis with new       high-grade proximal right coronary artery stenosis per cardiac       catheterization on December 19, 2008.  2. Ventilator dependent respiratory failure.  3. Peripheral arterial disease.      a.     ABIs April 2009 were 0.7 in the right and 0.7 on the left.  4. Hypertension.  5. Diabetes.  6. Hyperlipidemia, intolerant  to STATINS.  7. History of cerebrovascular accident.  8. Chronic dyspnea.   HOSPITAL COURSE:  This 77 year old Caucasian female with a known history  of coronary artery disease, diabetes, hypertension who had a lateral  myocardial infarction in 1999 with a stent to the left circumflex.  The  patient had recurrent chest pain in May 2005 and had high-grade proximal  lesion in LAD, which was stented at that time.  The patient had been  doing well; however, she had recent melanoma resection and a nonhealing  ulcer on the right lower extremity.  The night of admission, the patient  had substernal chest pain around 9 p.m., she called EMS around 3 a.m.  EKG showed lateral ST elevation and while in transport to hospital, the  patient was then had a VFib arrest.  She was intubated and started on  amiodarone along with CPR being initiated.  She was diverted to [facility] where she was revised and stabilized.  Then, a code STEMI was  activated and the patient was brought to the cardiac catheterization lab  for emergent catheterization.   The patient was seen emergently by Dr. Verdis Prime, cardiologist on-  call, and did undergo a cardiac catheterization with results as  described above.  Please see Dr. Michaelle Copas thorough cardiac  catheterization note for more details.  It was found that the patient  had moderate in-stent restenosis in the circumflex.  She had successful  angioplasty and restenting of the proximal LAD, and new high-grade  proximal right coronary artery stenosis.  The patient was recovered and  followed in ICU, postprocedure.  Echocardiogram was completed revealing  an EF of 35-40%.  The patient was subsequently extubated the following  day with mild CHF noted for which she was treated with IV Lasix and  began on p.o. Lasix daily.  Post-catheterization, the patient did have  evidence of severe bradycardia with a 7-second pause, which did not  reoccur.   On December 22, 2008, the patient had intervention to the right coronary  artery by Dr. Tonny Bollman with multiple drug-eluting stents to the  right coronary artery using overlapping endeavor drug-eluting stents 3.0  x 18 and 3.0 x 12-mm stents.  Then, the patient had a distal RCA  intervention with using a 2.5 x 18 endeavor drug-eluting stent.   The following day, the patient did have some complaints of mild chest  tightness, but it was transient.  The patient's blood pressure was  slightly hypotensive and Coreg and other antihypertensives were held  during this and the patient was followed closely.  The patient was also  known to have some transient confusion; however, this resolved the  following morning.   The patient did have a CT scan of the head to evaluate for recurrence of  TIA with known history of CVA and was found to have no acute  abnormality.  The patient continued to diurese with an additional dose  of IV Lasix on December 23, 2008, secondary to evidence of mild fluid  overload.  A followup PA and lateral chest x-ray was completed to  evaluate for CHF.   The patient had recurrence of some confusion.  A few days later, it was  believed per Dr. Earmon Phoenix note that it was secondary to hypoxic brain  injury in the setting of a cardiac arrest.  The patient had slow, but  sure improvement, but she did have some removal of catheters and IVs  during her stay in the ICU.  A chest x-ray by Dr. Excell Seltzer revealed no  active disease and there were no rib fractures noted.  She was  subsequently moved to telemetry on December 25, 2008, and case manager  became involved to assist with dressing home health issues.  The  patient's captopril was discontinued and changed to lisinopril, and the  patient's metoprolol and Norvasc continued to be placed on hold with  medication changes as above.   While on telemetry, the patient became more active.  She had no further  complaints of chest pain, but she did  have some mild soreness.  The  patient tolerated medication changes and was not hypotensive any more.  She was followed by cardiac rehab and did ambulate.  The patient was  able  to walk without any complaints of chest pain, although she did have  some mild shortness of breath.   On the day of discharge, the patient was seen and examined by Lenard Galloway, physician assistant and Dr. Rollene Rotunda, and found to be  stable for discharge.  The patient will follow up with Dr. Lewayne Bunting  and primary care physician within the next 2 weeks.  She will also be  followed by the home health for OT/PT along with evaluation of  medications and compliance issues.  The patient will also go home on O2  secondary to desaturation with ambulation into the low 80s.   DISCHARGE LABORATORY DATA:  Hemoglobin 11.9, hematocrit 37.5, white  blood cells 8.6, platelets 274, sodium 139, potassium 3.8, chloride 95,  CO2 38, BUN 18, creatinine 0.75, glucose 138.   Blood pressure on discharge 102/66, heart rate 85, respirations 18,  temperature 95% on 2 liters, temperature 97.5.   DISCHARGE MEDICATIONS:  1. Aspirin 81 mg daily.  2. Ergocalciferol 50,000 units on Saturdays.  3. Zetia 10 mg daily.  4. Glipizide 10 mg daily.  5. Colace 200 mg daily.  6. Plavix 75 mg daily.  7. Lasix 20 mg daily.  8. Lisinopril 2.5 mg daily.  9. Coreg 6.25 mg twice a day.  10.Nitroglycerin 0.4 mg under tongue p.r.n. chest pain.   The patient has been advised to stop taking ramipril 5 mg twice a day,  amlodipine 5 mg, and metoprolol.   ALLERGIES:  The patient is allergic to PENICILLIN.   FOLLOWUP PLANS AND APPOINTMENT:  1. The patient will follow up in Dr. Margarita Mail office in [place] in      approximately 7 days.  2. The patient will follow up with Dr. Donzetta Sprung, the patient will      call to make that follow up appointment.  3. The patient will be followed by home health in [place] for continued      evaluation, medical  compliance, and assistance.   Time spent with the patient to include physician time 45 minutes.      Bettey Mare. Lyman Bishop, NP      Bevelyn Buckles. Bensimhon, MD  Electronically Signed    KML/MEDQ  D:  12/28/2008  T:  12/29/2008  Job:  [id number]   cc:   Donzetta Sprung

## 2011-05-10 NOTE — Assessment & Plan Note (Signed)
[facility]                          [name]   DARE, SPILLMAN                       MRN:          [id number]  DATE:11/27/2007                            DOB:          Mar 25, 1933    PRIMARY CARDIOLOGIST:  Learta Codding, MD.   REASON FOR VISIT:  Scheduled followup.   HISTORY:  Since last seen here in the clinic this past May by Dr.  Andee Lineman, the patient continues to do extremely well from a cardiac  standpoint with no interim development of chest pain.  This is in the  wake of a hospitalization here at [facility] in early May when she  presented with angina pectoris relieved by nitroglycerin, but with  negative serial cardiac markers.  She was placed on low-dose Imdur at 15  mg (since discontinued) and was cleared for an outpatient stress test  which was absent of any definite evidence of ischemia; EF of 71%.   We also down-titrated her Altace when we saw her in the hospital,  secondary to significant hypotension.  The patient continues to remain  quite active with no associated exertional chest discomfort or dyspnea.  Of note, no medication adjustments were made when she was last seen here  by Dr. Andee Lineman.   CURRENT MEDICATIONS:  1. Altace 2.5 mg b.i.d.  2. Norvasc 2.5 mg daily.  3. Zetia 10 mg 3 times weekly.  4. Full dose aspirin.  5. Metoprolol 25 b.i.d.  6. Glipizide 10 daily.  7. Nitrostat p.r.n.   PHYSICAL EXAMINATION:  VITAL SIGNS:  Blood pressure 171/79, pulse 58  regular, weight 144.6.  GENERAL:  A 77 year old female sitting upright in no distress.  HEENT:  Normocephalic, atraumatic.  NECK:  Palpable bilateral carotid pulses without bruits.  LUNGS:  Clear to auscultation bilaterally.  HEART:  Regular rate and rhythm (S1 and S2).  No significant murmurs.  No rubs.  ABDOMEN:  Soft, nontender without bruits.  EXTREMITIES:  Palpable bilateral femoral pulses without bruits.  Minimally palpable dorsalis pedis and  posterior tibialis pulses.  NEURO:  Flat affect, but no focal deficit.   IMPRESSION:  1. Severe two-vessel coronary artery disease.      a.     Nonischemic adenosine stress Cardiolite; EF 71%, May 2008.      b.     Status post Cypher stenting left anterior descending artery,       May 2005; widely patent LAD stent, nonobstructive CFX stent, and       70% posterolateral branch stenosis by cardiac catheterization,       August 2005.      c.     Status post lateral myocardial infarction/stent circumflex       artery in 1999.  2. Hypertension.  3. Type 2 diabetes mellitus.  4. Dyslipidemia.  5. History of stroke.  6. Remote tobacco.  7. Asymptomatic sinus bradycardia.   PLAN:  1. Increase Norvasc to 10 mg daily for aggressive blood pressure      control.  2. Follow up blood pressure check here in our clinic in 1 week.  3.  Down-titrate aspirin initially to 162 daily, with goal of 81 mg      daily.  4. Schedule return to clinic to follow up with myself and Dr. Andee Lineman      in 3 months.      Gene Serpe, PA-C  Electronically Signed      Learta Codding, MD,FACC  Electronically Signed   GS/MedQ  DD: 11/27/2007  DT: 11/27/2007  Job #: [id number]   cc:   Donzetta Sprung

## 2011-05-10 NOTE — Assessment & Plan Note (Signed)
[name]                          [name]   [name], [name]                       MRN:          [id number]  DATE:02/12/2009                            DOB:          1933/11/20    REFERRING PHYSICIAN:  Donzetta [name]   HISTORY OF PRESENT ILLNESS:  The patient is a 77 year old female with  known coronary artery disease status post cardiac arrest and ventricular  fibrillation in the setting of a late stent thrombosis of the LAD.  She  also underwent staged intervention to the right coronary artery by [name].  Excell [name].  The patient is doing well currently.  She denies any chest  pain, shortness of breath, orthopnea, or PND.  She is compliant with her  medical regimen.  She knows to be on lifelong aspirin and Plavix.  Interestingly, we heard a left carotid bruit on exam today.   CURRENT MEDICATIONS:  1. Aspirin 325 daily.  2. Enalapril 5 mg p.o. b.i.d.  3. Glipizide 10 mg p.o. daily.  4. Lopressor 25 mg  half a tablet p.o. b.i.d.  5. Crestor 10 mg p.o. daily.  6. Vitamin D 55 units every month.  7. Celexa 20 mg p.o. daily.  8. Plavix 75 mg p.o. daily.  9. Lasix 20 mg p.o. daily.   PHYSICAL EXAMINATION:  VITAL SIGNS:  Blood pressure 156/70, heart rate  65, weight 132 pounds.  HEENT:  Eyes, pupils are clear.  Conjunctivae are clear.  NECK:  Supple.  Normal carotid upstroke with left carotid bruit.  Normal  carotid upstroke bilaterally.  No thyromegaly.  No nodular thyroid.  LUNGS:  Clear breath sounds bilaterally.  HEART:  Regular rate and rhythm.  Normal S1, S2.  No murmurs, rubs, or  gallops.  ABDOMEN:  Soft, nontender.  No rebound or guarding.  Good bowel sounds.  EXTREMITIES:  No cyanosis, clubbing, or edema.  NEUROLOGIC:  The patient is alert, oriented, and grossly nonfocal.   PROBLEM LIST:  1. Multivessel coronary artery disease.      a.     Status post ST-segment elevation myocardial       infarction/ventricular  fibrillation.  Cardiac arrest treated with       drug-eluting stent on proximal left anterior descending coronary       artery secondary to late stent thrombosis.      b.     Multiple drug-eluting stenting of the right carotid artery.      c.     Overall normal left ventricular function.      d.     Anterior apical akinesis.      e.     Relative hypotension.  2. Peripheral arterial disease.  Ankle brachial index 0.78 on the      right and 0.7 on the left, April 2009.  3. History of hypertension.  4. Type 2 diabetes mellitus.  5. Dyslipidemia, intolerant of Lipitor.  6. Remote tobacco use.  7. History of stroke.   PLAN:  1. The patient needs to continue on lifelong aspirin and Plavix.  2. Blood pressure is elevated.  To see if the stated home blood      pressure readings are within normal limits.  3. The patient has a left carotid bruit, and we will schedule her for      carotid Dopplers.  4. The patient can follow up with [place] in 6 months.     [name] Codding, MD,FACC  Electronically Signed    GED/MedQ  DD: 02/12/2009  DT: 02/13/2009  Job #: [id number]   cc:   [name]

## 2011-05-10 NOTE — Assessment & Plan Note (Signed)
[facility]                          [name]   [name], [name]                       MRN:          [id number]  DATE:05/23/2007                            DOB:          03-22-1933    HISTORY OF PRESENT ILLNESS:  The patient is a 77 year old female who was  recently admitted with substernal chest pain. The patient ruled out for  myocardial infarction. Imdur was added to her medical regimen. The  patient stated that she has markedly improved. She has had no recurrent  symptoms. She had a Cardiolite study done in the meanwhile which showed  no ischemia. The patient denies any shortness of breath, orthopnea or  PND.   MEDICATIONS:  1. Altace 2.5 mg p.o. daily.  2. Glipizide 10 mg p.o. daily.  3. Metoprolol 25 mg p.o. daily.  4. Norvasc 5 mg a day.  5. Aspirin 325 daily.  6. Zetia 10 mg a day.  7. Imdur 50 mg p.o. daily.   PHYSICAL EXAMINATION:  VITAL SIGNS:  Blood pressure 144/76, heart rate  66, weight 146 pounds.  NECK:  Normal carotid upstrokes, no carotid bruits.  LUNGS:  Clear breath sounds bilaterally.  HEART:  Regular rate and rhythm. Normal S1, S2. No murmurs, rubs or  gallops.  ABDOMEN:  Soft, nontender, no rebound or guarding. Good bowel sounds.  EXTREMITIES:  No cyanosis, clubbing or edema.   PROBLEM LIST:  1. Status post angina pectoris.      a.     Relieved by nitroglycerin.      b.     Ruled out for myocardial infarction.      c.     Cardiolite study for ischemia.  2. Severe two-vessel coronary artery disease.      a.     History of __________.      b.     CYPHER stent to the LAD May 2005.      c.     Status post patent stents August 2005 by cath.  3. Hypertension.  4. History of stroke.  5. History of hypertension.   PLAN:  1. The patient is hemodynamically stable. She has no recurrent      symptoms. She has no chest pain.  2. The patient can continue on her current medical regimen. The      patient  could have      microvascular disease causing to have chest pain. It is doubtful      that she has significant epicardial coronary artery disease based      on negative Cardiolite study.     Learta Codding, MD,FACC  Electronically Signed    GED/MedQ  DD: 05/23/2007  DT: 05/23/2007  Job #: [id number]

## 2011-05-10 NOTE — Assessment & Plan Note (Signed)
[facility]                          [name]   [name], [name]                       MRN:          [id number]  DATE:01/07/2009                            DOB:          04-26-1933    PRIMARY CARDIOLOGIST:  Learta Codding, MD, [facility]   REASON FOR VISIT:  Post hospital followup.   [name] is a delightful 77 year old female, with known coronary artery  disease, who recently presented to [facility] with cardiac arrest  secondary to ventricular fibrillation.  She was treated with CPR here at  [facility] initially, followed by mechanical ventilation, and  then directed to [facility] for emergent cardiac catheterization, in the  context of ST elevation myocardial infarction.  Dr. Verdis Prime did the  initial procedure and concluded that the VFib arrest was most likely due  to spontaneous recanalization of the LAD, after late stent thrombosis.  He proceeded with successful PTCA and restenting of the proximal LAD,  with a XIENCE V drug-eluting stent.  However, she was also noted to have  high-grade proximal RCA stenosis and moderate, in-stent restenosis of  the circumflex stent site.  Overall, LV function was normal, with  anterior apical akinesis.  Dr. Katrinka Blazing indicated the patient should remain  on aspirin and Plavix, indefinitely.   The patient then returned for staged percutaneous intervention of the  right coronary artery, by Dr. Tonny Bollman, undergoing successful  intervention with multiple, Endeavor drug-eluting stents.  She was  cleared for discharge on December 28, 2008.   Postop course was complicated by development of hypotension, on Coreg,  with temporary cessation.  There was also the issue of transient  confusion, but a followup CT scan of the head was negative.  There was  some suggestion that her symptoms may have been secondary to hypoxic  brain injury.   Of note, the patient was discharged on both ramipril  and lisinopril, in  addition to Coreg 6.25 b.i.d.  One day following discharge, our office  was contacted by home health nursing reporting blood pressures of 60/40  upon standing.  Dr. Andee Lineman ordered that both Coreg and lisinopril be  discontinued.   Subsequent blood pressure readings have been in the low 90-100 systolic  range.  Heart rates have been in the 55-100 range, as well.   Clinically, [name] is doing quite well, following the medication  adjustment.  She denies any presyncope/syncope or falls.  She has not  had any recurrent angina pectoris, which she does recall on the day of  admission.  She also denies any tachy palpitations, PND, orthopnea, or  significant lower extremity edema.   EKG in our office today indicates an SR of 93 bpm.   CURRENT MEDICATIONS:  1. Plavix.  2. Aspirin 81 daily.  3. Glipizide 10 daily.  4. Zetia 10 daily.  5. Lasix 20 daily.  6. Ramipril 5 b.i.d.   PHYSICAL EXAMINATION:  VITAL SIGNS:  Blood pressure 109/70, pulse 92,  regular, weight 130 (down 16 since last visit).  GENERAL:  A 77 year old female sitting upright in no distress.  HEENT:  Normocephalic, atraumatic.  NECK:  Palpable bilateral carotid pulse without bruits; no JVD at 90  degrees.  LUNGS:  Clear to auscultation in all fields.  HEART:  Regular rate and rhythm.  No significant murmurs.  No rubs.  No  S3.  ABDOMEN:  Soft, nontender.  EXTREMITIES:  Right groin stable with minimal ecchymosis, no hematoma,  and soft, bilateral femoral bruits.  Minimally palpable dorsalis pedis  pulses with no edema.  NEUROLOGIC:  No focal deficit.   IMPRESSION:  1. Multivessel coronary artery disease.      a.     Status post STEMI/VF cardiac arrest, treated with drug-       eluting stenting of proximal LAD, secondary to late stent       thrombosis.      b.     Staged, multiple drug-eluting stenting of RCA.      c.     Overall, normal LVF; anterior apical akinesis.  2. Relative  hypotension.      a.     Improved following discontinuation of Coreg.  3. Peripheral arterial disease.      a.     Ankle-brachial indices 0.78 right; 0.7 left, April 2009.  4. History of hypertension.  5. Type 2 diabetes mellitus.  6. Dyslipidemia.      a.     Intolerant to Lipitor.  7. Remote tobacco.  8. History of stroke.   PLAN:  1. Increase aspirin back to full-dose and the patient is to remain on      Plavix indefinitely.  We will subsequently down titrate aspirin to      81 mg daily, pending her clinical course.  2. Follow up BMET with a BNP level.  We will also check a fasting      lipid profile at that time as well, for reassessment of her lipid      status.  3. Coreg will not be resumed.  The patient had significant hypotension      with this medication.  Moreover, she was on metoprolol 25 mg b.i.d.      in the past and does need to be on a beta blocker.  We will      therefore resume this at the low dose of 12.5 mg b.i.d.  She will      return in 1 week for followup blood pressure/pulse check and, if      stable, we will consider up titrating her metoprolol to her      previous dose.  It is also to be noted that the results of the      initial, emergent coronary angiogram indicated normal overall left      ventricular function.  4. Schedule early clinic followup with myself and Dr. Andee Lineman in 1      month, for continued close monitoring.  If the patient remains      hemodynamically stable, then I will urge her to enroll in the      cardiac rehab program.      Rozell Searing, PA-C  Electronically Signed      Learta Codding, MD,FACC  Electronically Signed   GS/MedQ  DD: 01/07/2009  DT: 01/08/2009  Job #: [id number]

## 2011-05-10 NOTE — Cardiovascular Report (Signed)
NAMEAVON, MOLOCK NO.:  [id number]   MEDICAL RECORD NO.:  [id number]          PATIENT TYPE:  INP   LOCATION:  2915                         FACILITY:  [facility]   PHYSICIAN:  Lyn Records, M.D.   DATE OF BIRTH:  Aug 25, 1933   DATE OF PROCEDURE:  12/19/2008  DATE OF DISCHARGE:                            CARDIAC CATHETERIZATION   INDICATIONS FOR PROCEDURE:  V-fib cardiac arrest after developing chest  pain.  Prior history of circumflex and LAD drug-eluting stents.  Most  recently, the LAD implantation in 2005.   PROCEDURE PERFORMED:  1. Left heart catheterization.  2. Selective coronary angiography.  3. Left ventriculography.  4. Intravascular ultrasound.  5. Drug-eluting stent implantation, proximal left anterior descending.   DESCRIPTION:  The patient was transported in from [facility]  where she had undergone successful resuscitation and intubation for a  ventricular fibrillation cardiac arrest.  This occurred after the  patient developed chest pain and had been documented to have ST  elevation on electrocardiographic monitors.   An emergency cardiac catheterization was performed with the patient  being properly ventilated and supported by respiratory therapy team.  A  6-French sheath was placed in the right femoral artery using a modified  Seldinger technique.  A 6-French A2 multipurpose catheter was then used  for hemodynamic recordings, left ventriculography by hand injection, and  selective right coronary angiography.  We used a 3-cm 6-French CLS guide  catheter to obtain diagnostic images of the left coronary.  This  catheter unfortunately would not maintain guide support after wire  advancement into the distal vessel.  We upgraded to a 3.5 6-French CLS  guide catheter and used this to perform intervention.  The patient was  intubated and could not receive Plavix.  We administered the standard  bolus of 0.75 mg/kg of Angiomax followed by 1.75  mg/kg/hour infusion.  ACT was documented to be greater than 300 seconds.  We also gave a  single bolus followed by an infusion of Integrilin.   After crossing the stenosis in the LAD, which was the culprit vessel, we  performed angioplasty with a two 5 x 15-mm long apex balloon.  We then  performed intravascular ultrasound for vessel sizing.  The distal vessel  was less than 2.5 x 2.5 mm in diameter.  The proximal vessel was  eccentric near the stent origin, but was not greater than 2.5 mm in  diameter.  We then deployed a 28-mm long Xience V 2.5-mm diameter stent  to 9 atmospheres.  We postdilated with a Voyager Rhine 2.5 mm x 20 mm long  balloon.  Two balloon inflations each to 15 atmospheres were performed  distally and subsequently proximally.  Following post dilatation, there  was TIMI grade 3 flow noted.  There was good transition from native  vessel to stent without edge dissection or filling defects noted.   Angio-Seal was used for hemostasis.  No complications occurred.   RESULTS:  1. Hemodynamic data:      a.     Aortic pressure 89/46.      b.  Left ventricular pressure 98/9.  2. Left ventriculography:  There is anteroapical akinesis/severe      hypokinesis, although overall LV function is normal with an EF of      50-60% due to hypercontractility of the basal anterior wall and the      inferior wall.  No mitral regurgitation is noted.  3. Coronary angiography.      a.     Left main coronary:  There is ostial left main disease       obstructs the vessel by 30% to no more than 40%.  No catheter       damping is noted.  Calcification is present.      b.     Left anterior descending coronary:  LAD is a large vessel       that is transapical.  It is small in diameter.  Several diagonal       branches arise from this vessel.  The previously placed stent in       the proximal vessel contains hazy mottling throughout the entire       length of the stent with a relatively focal  greater than 80%       stenosis of the proximal stent margin.  It is felt that this is       the likely culprit of the patient's cardiac arrest with a presumed       spontaneous reperfusion after stent thrombosis.  The LAD is       transapical.      c.     Circumflex artery:  This is a large vessel giving origin to       3 obtuse marginal branches.  There is a stent in the mid-to-distal       circumflex proximal to a bifurcation into the second and third       obtuse marginal branches.  The distal stent contains moderate-to-       moderately severe in-stent restenosis graded to be in the 60%       range before the bifurcation.      d.     Right coronary:  The right coronary is a dominant vessel       giving origin to PDA and left ventricular branch.  A new high-       grade proximal stenosis of greater than 90% is noted.  There is       70% stenosis after the PDA origin in the continuation of the right       coronary.  4. PCI:  The LAD lesion as described above contain greater than 80%      stenosis with hazy probable thrombus throughout the entire length      of the previously placed Cypher stent.  After intravascular      ultrasound to document appropriate stent apposition and vessel      size, we deployed a 2.5-mm diameter x 28-mm long Xience V stent to      9 atmospheres and postdilated with a two 5 [place] Voyager with a final      angiographic result that was quite acceptable and no significant      stenosis noted within the stented region.  TIMI grade 3 flow was      noted.  Post deployment IVUS was not performed.   CONCLUSIONS:  1. Cardiac arrest secondary to ventricular fibrillation that occurred      presumably due to spontaneous recanalization of the LAD after late  stent thrombosis.  2. Successful angioplasty and restenting of the proximal LAD with a      Xience V drug-eluting stent.  3. Moderate in-stent restenosis in the circumflex stent.  4. New high-grade proximal RCA  stenosis.  5. Left ventricular dysfunction with anteroapical akinesis that will      likely improve.  Overall LV function is normal and LVEDP is normal.   PLAN:  1. IV hydration.  2. Aspirin and Plavix for life.  3. Integrilin until the patient could be loaded with Plavix.  4. Low-dose bivalirudin for 2 hours postprocedure.  5. Further management per Dr. Markus Daft in [facility].      Lyn Records, M.D.  Electronically Signed     HWS/MEDQ  D:  12/19/2008  T:  12/19/2008  Job:  [id number]   cc:   Arturo Morton. Riley Kill, MD, [facility]

## 2011-05-10 NOTE — Discharge Summary (Signed)
NAMEJHORDYN, [name]                ACCOUNT NO.:  [id number]   MEDICAL RECORD NO.:  [id number]          PATIENT TYPE:  INP   LOCATION:  4707                         FACILITY:  [facility]   PHYSICIAN:  Everardo Beals. Juanda Chance, MD, FACCDATE OF BIRTH:  1933/01/22   DATE OF ADMISSION:  05/04/2009  DATE OF DISCHARGE:  05/05/2009                               DISCHARGE SUMMARY   DISCHARGING PHYSICIAN:  Everardo Beals. Juanda Chance, MD, [facility]   PRIMARY CARDIOLOGIST:  Learta Codding, MD,FACC   PRIMARY CARE PHYSICIAN:  Donzetta Sprung.   DISCHARGING DIAGNOSIS:  Chest pain status post cardiac catheterization  showing patent stents.  Continue current medications.   PAST MEDICAL HISTORY:  1. Coronary artery disease/multivessel with stage multidrug eluting      stent to the right coronary artery and drug-eluting stent to the      proximal LAD.  The patient suffered a non-ST-elevated      MI/ventricular fibrillation cardiac arrest previously.  2. History of hypotension necessitating discontinuation of Coreg.  3. Peripheral arterial disease.  4. Moderate cerebrovascular disease.  5. History of hypertension.  6. Dyslipidemia.  7. Type 2 diabetes.  8. Remote tobacco abuse.  9. History of stroke.  10.History of depression.   PROCEDURES THIS ADMISSION:  Cardiac catheterization on May 05, 2009, by  Dr. Charlies Constable, recommending ongoing medical therapies.  The patient  has 50% restenosis of distal stent in the right coronary artery, normal  ejection fraction.   HOSPITAL COURSE:  [name] is a 77 year old Caucasian female with past  medical history as stated above, previous placement of stents to the  proximal LAD and RCA with a non-STEMI/ventricular fibrillation arrest.  She presented to [facility] complaining of left-sided  chest discomfort with radiation up into her left shoulder.  The patient  stated she had not had any chest discomfort since she suffered out-of-  the hospital ventricular fibrillation  cardiac arrest secondary to late  in-stent thrombosis noted in LAD back in January 2009.  The patient's  daughter felt her mother had developed some exertional dyspnea over the  past 2 weeks and was more fatigued.  Prior to admission, [name] took  nitroglycerin with subsequent prompt release of her discomfort, and in  fact, upon arrival to [facility], she did not have any  further chest discomfort.  Her blood pressure was elevated at 185/92.  EKG showed sinus rhythm without acute ST or T-wave changes, and cardiac  enzymes negative x3 sets.  The patient was seen by Dr. Andee Lineman and Gene  Serpe.   PLAN:  At that time recommendation for the patient to be transferred  directly to [facility] for diagnostic cardiac catheterization and  continued home medications.  The patient remained stable and was taken  to the Cath Lab on the morning of May 05, 2009.  Results as stated  above.  The patient tolerated the procedure without complications and is  being discharged home in stable condition to follow up with Dr. Andee Lineman  on May 27, 2009, at 2:15.  Continue home medications include:  1. Aspirin 325.  2. Altace 5 b.i.d.  3. Glipizide 10 mg daily.  4. Lopressor 12.5 mg b.i.d.  5. Crestor 10.  6. Plavix 75.  7. Lasix 20.  8. Celexa 20.  9. Nitroglycerin p.r.n.   DURATION OF DISCHARGE ENCOUNTER:  Less than 30 minutes.      Dorian Pod, ACNP      Bruce R. Juanda Chance, MD, [facility]  Electronically Signed    MB/MEDQ  D:  05/05/2009  T:  05/06/2009  Job:  [id number]   cc:   Donzetta Sprung

## 2011-05-10 NOTE — Cardiovascular Report (Signed)
[name], [name]                ACCOUNT NO.:  [id number]   MEDICAL RECORD NO.:  [id number]           PATIENT TYPE:   LOCATION:                                 FACILITY:   PHYSICIAN:  Bruce R. Juanda Chance, MD, FACCDATE OF BIRTH:  October 06, 1933   DATE OF PROCEDURE:  05/05/2009  DATE OF DISCHARGE:                            CARDIAC CATHETERIZATION   CLINICAL HISTORY:  Ms. Ditullio is 77 years old and has had multiple prior  percutaneous coronary interventions.  She had bare-metal stent to all 3  vessels.  On Christmas day 2009, she presented with an anterior  infarction with cardiac arrest and had a new XIENCE stent placed by Dr.  Garnette Scheuermann to the LAD.  Three days later, she had 3 Endeavor stents  placed in the right coronary artery for in-stent restenosis.  There were  2 overlapping stents in the proximal and midvessel and distal stent  placed.  She developed an episode of chest pain 2 or 3 days ago and was  seen in consultation by Dr. Andee Lineman and Gene Serpe and transferred to [place]  for evaluation with angiography.   PROCEDURE:  The procedure was performed with the right femoral artery as  an arterial sheath and 5-French preformed coronary catheters.  A front  wall arterial puncture was performed, and Omnipaque contrast was used.  The patient tolerated the procedure well and left the laboratory in  satisfactory condition.   RESULTS:  1. Left main coronary artery:  The left main coronary artery was free      of significant disease.  2. Left anterior descending artery.  The left descending artery gave      rise to 2 diagonal branches and 2 septal perforators.  There was a      long stent in the proximal LAD which was a XIENCE stent placed for      in-stent restenosis from a bare-metal stent, and this had less than      10% narrowing.  3. Circumflex artery.  The circumflex artery gave rise to an atrial      branch, a marginal branch, and then 2 posterolateral branches.      There was a stent  in the mid vessel, and just distal to the stent      there was a 50% narrowing just before the bifurcation into      posterolateral branches.  There was about 40% narrowing within the      stent.  4. Right coronary artery:  The right coronary artery was a moderate-      sized vessel.  It gave rise to a conus branch, a right ventricular      branch, posterior descending branch, and 2 posterolateral branches.      There was 30% narrowing within the Endeavor stent in the proximal      right coronary artery and 40% narrowing within the stent in the      Endeavor stent in the mid right coronary artery.  These were      overlapping stents.  The stented distal right coronary artery which  also has an Endeavor stent had 50% in-stent restenosis.  5. Left ventriculogram:  Left ventriculogram performed in the RAO      projection showed good wall motion with no areas of hypokinesis.      The estimated ejection fraction was 60%.  6. Aortic pressure was 114/40 with a mean of 65.  Left ventricular      pressure was 114/18.   CONCLUSION:  Coronary artery status post multiple percutaneous coronary  interventions with less than 10% stenosis within the XIENCE stent in the  proximal left anterior descending, 40% narrowing within the near stent  in the mid circumflex artery, and 50-70% narrowing in the distal  circumflex artery distal to the stent, 30% narrowing within the Endeavor  stent in the proximal right coronary artery, 40% narrowing within the  stent in the mid right coronary artery, and 50 cm narrowing within the  Endeavor stent in the distal right coronary artery, and normal left  ventricular function with an estimated ejection fraction of 60%.   RECOMMENDATIONS:  The patient appears to have only nonobstructive  disease.  It is a little concerning that there is 50% narrowing within  the stent in the distal right coronary artery at 5 months, but I do not  think it is bad enough to cause  symptoms to need treatment at the  present time.  We will plan reassurance and continued medical therapy.  We probably can discharge her later today.      Bruce Elvera Lennox Juanda Chance, MD, [facility]  Electronically Signed     BRB/MEDQ  D:  05/05/2009  T:  05/05/2009  Job:  [id number]   cc:   Noah Charon, MD,FACC

## 2011-05-13 NOTE — Assessment & Plan Note (Signed)
[facility]                          [name]   TACHA, MANNI                       MRN:          [id number]  DATE:02/12/2007                            DOB:          [birth year]    HISTORY OF PRESENT ILLNESS:  The patient is a 77 year old female who is  here for coronary artery disease status post Cypher stent.  The patient  is status post a recent melanoma surgery and complicated by a staph  infection.  The patient required a  prolonged hospitalization.  She is  now doing well.  She reports no chest pain, shortness of breath,  orthopnea, PND.  She appears to be stable from a cardiovascular  perspective and EKG today in the office also demonstrating a normal  sinus rhythm with no acute ischemic changes.   MEDICATIONS:  1. Altace 5 mg in the morning and Altace 2.5 in the evening.  2. Glipizide 10 mg daily.  3. Metoprolol 25  b.i.d.  4. Norvasc 5 daily.  5. Aspirin 325 daily.  6. Zetia 10 mg p.o. daily.   PHYSICAL EXAMINATION:  VITAL SIGNS:  Blood pressure is 138/68.  Heart  rate 62.  Weight 144 pounds.  NECK:  No carotid upstroke.  No carotid bruits.  LUNGS:  Breath sounds bilaterally.  HEART:  Regular rate and rhythm with normal S1, S2.  ABDOMEN:  Soft.  EXTREMITIES:  No cyanosis, clubbing, or edema.  NEURO:  The patient is alert, oriented, and grossly nonfocal.   PROBLEMS:  1. Status post melanoma excision.  2. Status post complicated staphylococcal aureus infection.  3. Coronary artery disease.      a.     Status post Cypher stent to the left anterior descending.      b.     Cutting balloon to the circumflex.      c.     Catheterization in August 2005 with 70% stenosis in a       posterolateral branch.      d.     Myocardial infarction, 1999.  4. Normal left ventricular function.  Ejection fraction 5%.  5. Diabetes mellitus.  6. Dyslipidemia.  7. Peripheral vascular disease.   PLAN:  1. The patient is stable  from a cardiovascular perspective.  Her EKG      shows no changes.  2. We will continue her on the current medical regimen, although Zetia      could be potentially stopped.  The patient will need a lipid panel      drawn.  She can have this done with Dr. Reuel Boom.  If not, we will do      it at our next clinic visit.  3. The patient has questioned me whether she still needs to use oxygen      at home, which she has not been using for a long time.  I told her      that this likely can be discontinued but to check with Dr. Reuel Boom      on this issue.  4. The patient can followup with [place] in  6 months.     Learta Codding, MD,FACC  Electronically Signed    GED/MedQ  DD: 02/12/2007  DT: 02/12/2007  Job #: [id number]   cc:   Donzetta Sprung

## 2011-05-13 NOTE — Cardiovascular Report (Signed)
[name], [name]                          ACCOUNT NO.:  [id number]   MEDICAL RECORD NO.:  [id number]                   PATIENT TYPE:  OIB   LOCATION:  6524                                 FACILITY:  [facility]   PHYSICIAN:  Arturo Morton. Riley Kill, M.D. [facility]         DATE OF BIRTH:  May 18, 1933   DATE OF PROCEDURE:  05/06/2004  DATE OF DISCHARGE:  05/07/2004                              CARDIAC CATHETERIZATION   INDICATIONS:  [name] is known to our service.  She is a 77 year old  female who previously underwent stenting of [name] circumflex coronary artery.  Recently she has developed some shortness of breath.  She had a borderline  Cardiolite, but Dr. Andee Lineman recommended cardiac catheterization after  discussion with [name] patient and her daughter.  This was accomplished by Dr.  Vida Roller.  This revealed a high grade stenosis of [name] LAD with some  continued patency of [name] circumflex stent.  There was moderate disease of  [name] right coronary with [name] lesion in [name] proximal portion of [name] posterior  lateral continuation branch of [name] right coronary and also proximal lesion  of about 50-60%.  [name] LAD appeared to be flow-limiting in [name] LAO  projection.  Based on this, percutaneous coronary intervention was  recommended.  Risks, benefits and alternatives were discussed with [name]  patient and her [name] prior to [name] procedure in [name] day cath section.   PROCEDURES:  Percutaneous stenting of [name] left anterior descending artery.   DESCRIPTION OF PROCEDURE:  [name] patient was brought to [name] catheterization  lab and prepped and draped in [name] usual fashion.  Through an anterior  puncture, [name] left femoral artery was easily entered and a 6 French sheath  was placed.  We initially used a JL-4 6 [place] guiding catheter.  Bivalirudin was administered according to protocol.  [name] ACT was checked and  found to be in excess of 300 seconds.  [name] patient had been previously  loaded with Plavix of approximately two  hours earlier.  [name] lesion was  crossed with a 0.014 Hi-Torque Floppy wire.  Based on [name] lesion, we  initially placed a 2.25 x 10 cutting balloon and this was taken up to 6 or 7  atmospheres.  This was subsequently removed and then a 2.5 Quantum Maverick  was utilized across [name] area and inflation was done across [name] lesion.  This  ensured that [name] balloon would fully inflate.  [name] artery was then stented  using a 2.5 x 23 [employer] Cordis Cypher stent.  There was some  difficulty getting [name] stent down [name] artery, but we were gently able to  manipulate [name] stent down [name] vessel.  This was taken up to about 13  atmospheres.  Following this, post dilatation was done using a 2.5 PowerSail  balloon throughout [name] course of [name] stent at fairly high pressure.  An  excellent angiographic result was obtained without side branch loss.  All  catheters were subsequently removed.   Prior to [name] procedure, [name] left anterior descending artery demonstrated  some luminal irregularity proximally.  Particularly, in [name] LAO projection,  there was a high grade stenosis in [name] midst of at least moderate  calcification.  This extended down to near [name] bend.  There was also a  slight filling defect at [name] bend which could be related to a side branch.  [name] stent was layed across these two areas, and an excellent final  angiographic appearance achieved with 0 residual stenosis and good TIMI-3  runoff into [name] distal vessel without any evidence of edge tear.   CONCLUSIONS:  Successful percutaneous stenting of [name] left anterior  descending artery.   ADDENDUM:  At [name] completion of [name] procedure, [name] femoral sheath was  reprepped.  A guide wire was placed into [name] femoral artery and [name] sheath  removed and replaced by [name] Angio-Seal device.  Excellent hemostasis was  achieved by [name] Angio-Seal with continued pulse present.  This was  appropriately dressed and she was taken to [name] holding area in  satisfactory  clinical condition.                                               Arturo Morton. Riley Kill, M.D. [facility]    TDS/MEDQ  D:  05/06/2004  T:  05/07/2004  Job:  [id number]   cc:   Learta Codding, M.D. [facility]  [address]  [place]  [place] [address]  Fax: [phone]   Vida Roller, M.D.  Fax: [phone]

## 2011-05-13 NOTE — Cardiovascular Report (Signed)
[name], [name]                          ACCOUNT NO.:  [id number]   MEDICAL RECORD NO.:  [id number]                   PATIENT TYPE:  INP   LOCATION:  2033                                 FACILITY:  [facility]   PHYSICIAN:  Arturo Morton. Riley Kill, M.D. [facility]         DATE OF BIRTH:  08-31-1933   DATE OF PROCEDURE:  08/18/2004  DATE OF DISCHARGE:                              CARDIAC CATHETERIZATION   INDICATIONS:  [name] is a delightful 77 year old woman who previously has  had stenting of the circumflex coronary artery and the left anterior  descending artery.  She has presented with some recurrent left arm  discomfort.  Because of this, repeat cardiac catheterization was indicated.  She was seen by the [facility] team and referred for repeat catheterization.   PROCEDURE:  1. Left heart catheterization.  2. Selective coronary arteriography.  3. Selective left ventriculography.  4. Subclavian angiography.   DESCRIPTION OF PROCEDURE:  The patient was brought to the catheter lab and  prepped and draped in the usual fashion through an anterior puncture of the  right femoral artery and was easily entered.  A 6 French sheath was then  placed.  Views of the left and right coronary arteries were obtained in  multiple angiographic projections.   A left subclavian angiogram was also performed.  Central aortic and left  ventricular pressures were measured with a pigtail ventriculography  performed in the RAO projection.   I then left the suite and moved to the review area where we compared the old  study and the new study in detail.  At this point, I reviewed the films with  Dr. Olga Millers.  It was our feeling that Adenosine-Cardiolite should be  undertaken to determine ischemia and further decisions based on that  finding.  The patient was taken off the table and into the holding area in  satisfactory clinical condition.   HEMODYNAMIC DATA:  1. Central aorta:  140/70, mean 98.  2. Left  ventricular 147/15.  3. No gradient on pull back across the aortic valve.   ANGIOGRAPHIC DATA:  1. On plain fluoroscopy, there was evidence of previously placed stents in     both the circumflex and the LAD.  There was also calcifications of the     coronaries.  2. The left ventriculogram demonstrates some ectopy.  Overall systolic     function is very vigorous in an excess of 70% on post-PVC beats.  There     is trace mitral regurgitation on at least one beat.  3. The subclavian has mild Luminal irregularity but is patent as is the     internal mammary.  4. The left main is free of critical disease.  5. The left anterior descending artery courses to the apex and is previously     stented.  There is a 23 mm Cypher stent which traverses across two     diagonal branches.  Both  of these are extremely small diagonal branches;     the first of which has perhaps 70% ostial narrowing but is a tiny vessel.     The stented site itself is widely patent without evidence of renarrowing.     There is perhaps 30% narrowing in the LAD just beyond the stented site.     The LAD opens up and with mild Luminal irregularity but no critical     narrowing.  6. The circumflex is previously stented as well.  There is a first marginal     branch that has segmental plaquing of about 50-70% in the proximal     portion leading up to the AV circumflex.  The AV circumflex is previously     stented.  In the distal portion of this area, there is about 70% smooth     narrowing and this crosses over the area of the takeoff of the AV     circumflex and the marginal branch which has about 50% narrowing.  The     actual mean lumen diameter appears to be slightly lower than 2 mm and     therefore could cause ischemia.  7. The right coronary artery is a dominant vessel with a 40% area of     proximal narrowing, 40-50% mid, and 50% mid to distal.  Just after the     takeoff of the posterior descending branch is an area of  focal narrowing     of about 70-75% and there is 70% narrowing in the steep end of the larger     posterolateral branch after the takeoff of the smaller posterolateral     branch.  Both of these also are slightly less than 2 mm in minimum lumen     diameter.   IMPRESSION:  1. Continued patency of the previously placed stent in the high grade left     anterior descending artery stenosis.  2. Continued patency of the previously placed circumflex stent from 1999     with partial renarrowing in this location.  3. Scattered lesions of the right coronary artery as described above.   DISPOSITION:  Both the circumflex and the right coronary artery have areas  where the minimum lumen diameter is less than 2.  These certainly could be  some focal sources of ischemia and yet the symptoms are somewhat atypical  and not clearly reflective of ischemia.  Furthermore, which is causing the  problem would be difficult to discern.  Neither is perfectly ideal for  percutaneous intervention. In the case of the circumflex, this extends into  the marginal branch and overlaps the takeoff of a very large marginal  branch.  This would probably require drug-eluding stenting within the  previously placed stent and across a takeoff of the large marginal branch.  Also, with regard to just balloon dilatation or cutting balloon dilatation  in the previously placed stent, the patient has a higher risk of just plain  restenosis in this territory.  The posterolateral lesions and the  continuation branch lesions are also not perfectly ideal for stenting.   Based on all these findings, my inclination could be to treat her medically.  We will get Adenosine-Cardiolite scan tomorrow to exclude important ischemia  in the circumflex or posterolateral territories.  If these were to be  markedly abnormal, then consideration could be given to stenting as  described in the above text.  I have called Dr. Donzetta Sprung and talked  to him about  the plans and recommendations.  I will discuss this with the  patient's family.                                               Arturo Morton. Riley Kill, M.D. [facility]    TDS/MEDQ  D:  08/18/2004  T:  08/19/2004  Job:  [id number]   cc:   Donzetta Sprung, M.D.   Learta Codding, M.D. [facility]   CV Laboratory   Patient's medical record

## 2011-05-13 NOTE — Discharge Summary (Signed)
[name], [name]                          ACCOUNT NO.:  [id number]   MEDICAL RECORD NO.:  [id number]                   PATIENT TYPE:  OIB   LOCATION:  6524                                 FACILITY:  [facility]   PHYSICIAN:  Arturo Morton. [name], M.D. [facility]         DATE OF BIRTH:  1933-10-04   DATE OF ADMISSION:  05/06/2004  DATE OF DISCHARGE:  05/07/2004                                 DISCHARGE SUMMARY   PROCEDURES:  1. Cardiac catheterization.  2. Coronary arteriogram.  3. PTCA with cutting balloon and CYPHER stent to one vessel.   HOSPITAL COURSE:  [name] is a 77 year old female who had shortness of  breath and was evaluated, in the office in [place], on April 20, 2004.  She had  a cardiac catheterization, on Apr 29, 2004, which showed a 70% LAD lesion in  the proximal portion.  She also had a 70% stenosis in the posterior  communicating artery between the posterior lateral branches and distal RCA.  Her EF was 55%.  Her Cardiolite showed anterior ischemia, and so she was  brought back for percutaneous intervention on the LAD.  She was admitted for  this on May 06, 2004.  She was treated with PTCA and a cutting balloon and a  CYPHER stent, reducing the stenosis from 80-90% to 0.  Dr. [name] achieved  an excellent result and her groin was closed with Angioseal.   Her post procedure EKG had no new changes and her post procedure enzymes  were negative.   [name] complained of acute on chronic lower extremity edema and had HCTZ  added to her medication regimen.  She will get a followup B-MET in the  office.  Her diabetes medications are being reviewed by Dr. [name], and he  will determine if Avandia is possibly contributing to her edema and if any  changes need to be made.  She will followup as an outpatient with Dr.  Reuel [name].  Pending evaluation by Dr. [name], [name] is considered stable  for discharge on May 07, 2004.   LABORATORY VALUES:  Hemoglobin 12.1, hematocrit 36.5, WBCs  7.9, platelets  170.  Sodium 139, potassium 3.7, chloride 103, CO2 31, BUN 9, creatinine  0.9, glucose 166.  Post procedure CK-MB 227/3.5.   DISCHARGE CONDITION:  Improved.   DISCHARGE DIAGNOSES:  1. Unstable anginal pain, status post percutaneous transluminal coronary     angioplasty and CYPHER stent to the left anterior descending artery.  2. Status post cardiac catheterization, on Apr 29, 2004, with residual     coronary artery disease in the right coronary artery area and an ejection     fraction of 55% with 1+ MR.  3. Diabetes.  4. Hyperlipidemia.  5. Hypertension.  6. Allergy to PENICILLIN and intolerant of ZOCOR.  7. History of transient ischemic attacks.  8. Peripheral vascular disease.  9. Status post hysterectomy.  10.  Status post myocardial infarction, in 1999, with stent to the     circumflex.  11.      Remote history of tobacco use, 30 pack year history.   DISCHARGE INSTRUCTIONS:  1. Her activity level is to include no driving for 2 days and no strenuous     activity until cleared by M.D.  2. She is to stick to a low fat diabetic diet.  3. She is to call the office for problems with the catheter site.  4. She is to get a B-MET at __________  .  5. She is to see [name]. [name]. for [name], on Friday, May 27th     at 10:45 and get a B-MET at 8:30 a.m. on the same day.  6. She is to followup with [name] as well.   DISCHARGE MEDICATIONS:  1. Plavix 75 mg every day.  2. Nitroglycerin sublingual p.r.n.  3. HCTZ 25 mg one half tab every day.  4. Lipitor 20 mg q.h.s.  5. Metoprolol 100 mg b.i.d.  6. Glipizide 10 mg every day.  7. Altace 5 mg b.i.d.  8. Aspirin 325 mg every day.  9. Avandia 4 mg one half tab every day.      [name], P.A. [facility]                  [name], M.D. [facility]    RB/MEDQ  D:  05/07/2004  T:  05/08/2004  Job:  [id number]   cc:   [facility]  [place], [place]   Donzetta Sprung  [address]  [place]  [place]  [address]  Fax: [phone]

## 2011-05-13 NOTE — Discharge Summary (Signed)
NAMECHINENYE, KATZENBERGER                          ACCOUNT NO.:  [id number]   MEDICAL RECORD NO.:  [id number]                   PATIENT TYPE:  INP   LOCATION:  2033                                 FACILITY:  [facility]   PHYSICIAN:  Learta Codding, M.D. [facility]             DATE OF BIRTH:  17-Jul-1933   DATE OF ADMISSION:  08/17/2004  DATE OF DISCHARGE:  08/19/2004                           DISCHARGE SUMMARY - REFERRING   HISTORY OF PRESENT ILLNESS:  Ms. Ruest is a 77 year old, white female who on  the evening prior to presentation at [facility] on August 23, began  having a dull aching sensation in her left arm into her hand.  She took  three nitroglycerin, but the discomfort did not release and her daughter  brought her to the emergency room.  She denies any associated chest  discomfort, shortness of breath, nausea, vomiting or diaphoresis.  In the  emergency room, her symptoms finally resolved.  Her history is notable for  known coronary artery disease with a myocardial infarction in 1999, stent  placement in 1999, and another one in 2005.  She has a history of non-  insulin dependent diabetes, hypertension, TIAs and hyperlipidemia.   LABORATORY DATA AND X-RAY FINDINGS:  Laboratory data from [facility]  is not on the chart.  At [facility] on August 25, sodium 140,  potassium 4.2, BUN 12, creatinine 0.8, glucose 150, H&H 12.5 and 37.0 with  normal indices.  Platelets 200, WBC 5.8.   Chest x-ray at [facility] showed COPD with no active disease.  EKGs showed  normal sinus rhythm with a rate of 62, normal axis, nonspecific ST and T  wave changes.   HOSPITAL COURSE:  Ms. Glasner was initially admitted to [facility] on  August 23, and seen in consultation by Arnette Felts, P.A. and Dr. Myrtis Ser.  She  was transferred to [facility] for cardiac catheterization to further  evaluate her symptoms given her history.  She was transported on August 17, 2004, by [employer].  Nursing notes  noted that her blood pressure remained  elevated at 200/100.  She was started on Norvasc 5 mg.  Catheterization was  performed by Dr. Riley Kill and this showed native three-vessel coronary artery  disease that ranged from 30-70%.  The stent to the LAD did not have any  restenosis.  The stent to the circumflex did have some 70% restenosis areas.  EF was 70% with slight MR.  Dr. Riley Kill noted there were multiple areas of  potential ischemia.  He recommended an adenosine Cardiolite to further  evaluate potential ischemia.  Carotid Dopplers were also performed on August  24, and this showed a right 40-60% high end of range ICA stenosis and ECA  stenosis.  She had a left 40-60% mid range of ICA stenosis and ECA stenosis.  Vertebral flow was antegrade.  It was noted on August 25, by nursing that  the patient was confused.  It was felt that this may be medication related.  She was assisted back to bed and side rails of her bed were placed in the up  position.  Dopplers of the right groin were also performed to further  evaluate right groin bleed on August 25.  This did not show any evidence of  right groin pseudoaneurysm or AV fistula.  On August 19, 2004, Dr. Andee Lineman  saw the patient and felt that if the adenosine Cardiolite was within normal  limits the patient could be discharged home.  It was felt that she would  need followup with Dr. Reuel Boom and need psychosocial support.  At the time of  discharge, her blood pressure was controlled.   DISCHARGE DIAGNOSES:  1. Progressive coronary artery disease per catheterization, however,     negative adenosine Cardiolite, continue medical treatment.  It is noted     by Dr. Andee Lineman that the stenoses are not favorable for angioplasty.  2. Questionable delusional anxiety or depression.  3. Hypertension with history as previously described.   DISPOSITION:  The patient is discharged home.   DISCHARGE MEDICATIONS:  1. Zocor increased to 20 mg q.h.s.  2.  Altace increased to 5 mg b.i.d.  3. New prescription for Norvasc 5 mg q.d.  4. Aspirin 325 mg q.d.  5. Glipizide 10 mg q.d.  6. Plavix 75 mg q.d.  7. Metoprolol 50 mg b.i.d.  8. Sublingual nitroglycerin as needed.   ACTIVITY:  She was advised no lifting, driving, sexual activity or heavy  exertion x2 days.   DIET:  Maintain low salt, low fat, low cholesterol, ADA diet.   SPECIAL INSTRUCTIONS:  If she has any problems with her catheterization  site, she will call our office.   FOLLOW UP:  She was asked to call Dr. Margarita Mail office in the morning to  arrange a followup 2 week appointment.  She was also asked to arrange  followup with Dr. Reuel Boom.      Joellyn Rued, P.A. [facility]                    Learta Codding, M.D. [facility]    EW/MEDQ  D:  08/19/2004  T:  08/19/2004  Job:  [id number]   cc:   [facility]/Dr. DeGent  [address]  [place], [place] [address]   Donzetta Sprung  [address]  [place]  [place] [address]  Fax: [phone]

## 2011-05-13 NOTE — Cardiovascular Report (Signed)
NAME:  [name], [name]                          ACCOUNT NO.:  [id number]   MEDICAL RECORD NO.:  [id number]                   PATIENT TYPE:  OIB   LOCATION:  NA                                   FACILITY:  [facility]   PHYSICIAN:  Vida Roller, M.D.                DATE OF BIRTH:  25-Apr-1933   DATE OF PROCEDURE:  04/29/2004  DATE OF DISCHARGE:                              CARDIAC CATHETERIZATION   PRIMARY CARE PHYSICIAN:  Donzetta Sprung, M.D., [place], [place].   CARDIOLOGIST:  Learta Codding, M.D.   HISTORY OF PRESENT ILLNESS:  This is a 77 year old woman with known coronary  artery disease, status post stenting to her circumflex coronary artery in  1999 after a lateral wall myocardial infarction.  She presents now with  chest discomfort and has ischemia in the anterior wall.   DETAILS OF PROCEDURE:  After obtaining informed consent, the patient was  brought to the cardiac catheterization laboratory in the fasting state.  There she was prepped and draped in the usual sterile manner and the right  groin was anesthetized using 1% lidocaine without epinephrine.  The right  femoral artery was cannulated using the modified Seldinger technique with a  4 French 10 cm sheath, and left heart catheterization was performed using a  4 French Judkins right #4, a 4 French Judkins left #4, a 4 [place] three-  dimensional right coronary artery, and a 6 French pigtail catheter.  The  three-dimensional right coronary catheter was seated in the right coronary  artery.  The pigtail catheter was used for left ventriculography, which was  performed in the 30 degree RAO view and also distal aortography, which was  performed in the AP view.  At the conclusion of the procedure the catheters  were removed.  The patient was brought back to the cardiology holding area,  where the femoral artery sheath was removed, hemostasis was obtained using  direct manual pressure.  At the conclusion of the hold there was no  evidence  of ecchymosis or hematoma formation.  Distal pulses were intact.   Total fluoroscopic time was 6.9 minutes.  Total iodinized contrast 160 mL.   RESULTS:  Aortic pressure was initially measured at 240/150 but after  aggressive medications was brought down to the 150/60 range.  The left  ventriculogram revealed a pressure of 191/12 with an end-diastolic pressure  of 19 mmHg.  This was when the blood pressure was reasonably high.  There  was no evidence of an aortic gradient.   CORONARY ANGIOGRAPHY:  1. The left main coronary artery is a moderate-caliber vessel which is     angiographically unremarkable.  2. The left circumflex coronary artery is a large vessel which is     nondominant, has multiple branching obtuse marginals.  There is an easily-     seen stent in the midportion of the circumflex coronary artery, which is  widely patent with only minimal in-stent restenosis in its distal     portion.  3. Left anterior descending coronary artery is a large, tortuous vessel     which has a 70% lesion in its proximal portion at the takeoff of a small     diagonal branch.  4. The right coronary artery is a large, dominant vessel with a 70% lesion     in the posterior communicating artery in between the posterolateral     branches and the distal right coronary.  5. Left ventriculogram reveals an ejection fraction of 55% with inferior,     inferobasal hypokinesis and only 1+ MR.  6. The distal aortography reveals normal-sized aorta with widely patent     renal arteries.   ASSESSMENT:  1. This is a woman with severe two-vessel coronary artery disease and     anterior ischemia on myocardial perfusion imaging.  2. Severe hypertension.   PLAN:  Aggressively treat her high blood pressure.  Once her blood pressure  is under control, consideration could be made for percutaneous  revascularization of her left anterior descending coronary artery.                                                Vida Roller, M.D.    JH/MEDQ  D:  04/29/2004  T:  04/30/2004  Job:  [id number]   cc:   Donzetta Sprung  [address]  [place]  [place] [address]  Fax: [phone]   Learta Codding, M.D. [facility]

## 2011-06-09 ENCOUNTER — Encounter: Payer: Self-pay | Admitting: Cardiology

## 2011-07-21 ENCOUNTER — Ambulatory Visit (INDEPENDENT_AMBULATORY_CARE_PROVIDER_SITE_OTHER): Payer: BC Managed Care – PPO | Admitting: Cardiology

## 2011-07-21 ENCOUNTER — Encounter: Payer: Self-pay | Admitting: Cardiology

## 2011-07-21 ENCOUNTER — Encounter (INDEPENDENT_AMBULATORY_CARE_PROVIDER_SITE_OTHER): Payer: BC Managed Care – PPO | Admitting: *Deleted

## 2011-07-21 ENCOUNTER — Telehealth: Payer: Self-pay | Admitting: *Deleted

## 2011-07-21 DIAGNOSIS — I6529 Occlusion and stenosis of unspecified carotid artery: Secondary | ICD-10-CM

## 2011-07-21 DIAGNOSIS — I10 Essential (primary) hypertension: Secondary | ICD-10-CM

## 2011-07-21 DIAGNOSIS — E785 Hyperlipidemia, unspecified: Secondary | ICD-10-CM

## 2011-07-21 DIAGNOSIS — I2510 Atherosclerotic heart disease of native coronary artery without angina pectoris: Secondary | ICD-10-CM

## 2011-07-21 NOTE — Telephone Encounter (Signed)
Patient is in the office now and Dr. Andee Lineman is requesting Cartoid Dopplers. Checking percert.

## 2011-07-21 NOTE — Patient Instructions (Signed)
   Carotid doppler today in office If the results of your test are normal or stable, you will receive a letter.  If they are abnormal, the nurse will contact you by phone. Your physician wants you to follow up in: 6 months.  You will receive a reminder letter in the mail one-two months in advance.  If you don't receive a letter, please call our office to schedule the follow up appointment

## 2011-07-21 NOTE — Telephone Encounter (Signed)
No precert required 

## 2011-07-26 NOTE — Progress Notes (Signed)
HPI The patient is a 77 year old female with a history of numbness elevation microinfarction complicated by ventricular fibrillation and previously treated with drug-eluting stent to the proximal LAD. She had late in-stent thrombosis in January 2009. The patient also had multiple drug-eluting stents in right coronary artery as well as circumflex coronary artery. Her last cardiac catheterization was in May of 2010 and demonstrated 50-70% distal circumflex stenosis, patent LAD stents and 50% distal RCA in-stent restenosis. Ejection fraction is 60%. The patient also is carotid disease with 50-69% stenosis on the right side and less than 50% on the left. His followup by primary care physician in regards to her blood work. Her LDL is apparently below 100. EKG shows no acute changes today. She states is doing well. She denies any chest pain shortness of breath orthopnea PND. She has no palpitations or syncope  Allergies  Allergen Reactions  . Atorvastatin     REACTION: myalgia  . Penicillins     REACTION: rash    Current Outpatient Prescriptions on File Prior to Visit  Medication Sig Dispense Refill  . aspirin 325 MG EC tablet Take 325 mg by mouth daily.        . citalopram (CELEXA) 20 MG tablet Take 20 mg by mouth daily.        . clopidogrel (PLAVIX) 75 MG tablet Take 75 mg by mouth daily.        . furosemide (LASIX) 20 MG tablet Take 20 mg by mouth 3 (three) times a week.        Marland Kitchen glipiZIDE (GLUCOTROL) 10 MG tablet Take 10 mg by mouth daily.        . metoprolol tartrate (LOPRESSOR) 25 MG tablet Take 25 mg by mouth 2 (two) times daily. Take 1/2 tab       . nitroGLYCERIN (NITROSTAT) 0.4 MG SL tablet Place 0.4 mg under the tongue every 5 (five) minutes as needed.        . ramipril (ALTACE) 5 MG capsule Take 5 mg by mouth 2 (two) times daily.        . rosuvastatin (CRESTOR) 10 MG tablet Take 10 mg by mouth daily.          Past Medical History  Diagnosis Date  . Coronary artery disease    multivessel  . Hypotension   . Peripheral artery disease   . Hypertension     hx  . Diabetes mellitus, type 2   . Dyslipidemia   . Tobacco user     remote  . Stroke     No past surgical history on file.  No family history on file.  History   Social History  . Marital Status: Widowed    Spouse Name: N/A    Number of Children: N/A  . Years of Education: N/A   Occupational History  . Not on file.   Social History Main Topics  . Smoking status: Never Smoker   . Smokeless tobacco: Not on file  . Alcohol Use: No  . Drug Use: No  . Sexually Active: Not on file   Other Topics Concern  . Not on file   Social History Narrative  . No narrative on file   [id number] positives as outlined above. The remainder of the 18  point review of systems is negative  PHYSICAL EXAM BP 128/69  Pulse 64  Resp 18  Ht 5\' 6"  (1.676 m)  Wt 132 lb 1.9 oz (59.929 kg)  BMI 21.32 kg/m2  SpO2 90%  General: Well-developed, well-nourished in no distress Head: Normocephalic and atraumatic Eyes:PERRLA/EOMI intact, conjunctiva and lids normal Ears: No deformity or lesions Mouth:normal dentition, normal posterior pharynx Neck: Supple, no JVD.  No masses, thyromegaly or abnormal cervical nodes Lungs: Normal breath sounds bilaterally without wheezing.  Normal percussion Cardiac: regular rate and rhythm with normal S1 and S2, no S3 or S4.  PMI is normal.  No pathological murmurs Abdomen: Normal bowel sounds, abdomen is soft and nontender without masses, organomegaly or hernias noted.  No hepatosplenomegaly MSK: Back normal, normal gait muscle strength and tone normal Vascular: Pulse is normal in all 4 extremities Extremities: No peripheral pitting edema Neurologic: Alert and oriented x 3 Skin: Intact without lesions or rashes Lymphatics: No significant adenopathy Psychologic: Normal affect  ECG: Normal sinus rhythm. No acute changes  ASSESSMENT AND PLAN

## 2011-07-26 NOTE — Assessment & Plan Note (Signed)
Followed by the patient's primary care physician 

## 2011-07-26 NOTE — Assessment & Plan Note (Signed)
Patient will need followup carotid Dopplers. They will be scheduled in the office today

## 2011-07-26 NOTE — Assessment & Plan Note (Signed)
Blood pressure well controlled. No change in medical therapy. 

## 2011-07-26 NOTE — Assessment & Plan Note (Signed)
Patient has multiple prior stents. However she denies any chest pain. She appears to have no evidence of ischemia. Continue medical therapy and risk factor modification

## 2011-09-30 LAB — GLUCOSE, CAPILLARY
Glucose-Capillary: 100 mg/dL — ABNORMAL HIGH (ref 70–99)
Glucose-Capillary: 108 mg/dL — ABNORMAL HIGH (ref 70–99)
Glucose-Capillary: 135 mg/dL — ABNORMAL HIGH (ref 70–99)
Glucose-Capillary: 138 mg/dL — ABNORMAL HIGH (ref 70–99)
Glucose-Capillary: 140 mg/dL — ABNORMAL HIGH (ref 70–99)
Glucose-Capillary: 148 mg/dL — ABNORMAL HIGH (ref 70–99)
Glucose-Capillary: 150 mg/dL — ABNORMAL HIGH (ref 70–99)
Glucose-Capillary: 157 mg/dL — ABNORMAL HIGH (ref 70–99)
Glucose-Capillary: 167 mg/dL — ABNORMAL HIGH (ref 70–99)
Glucose-Capillary: 173 mg/dL — ABNORMAL HIGH (ref 70–99)
Glucose-Capillary: 226 mg/dL — ABNORMAL HIGH (ref 70–99)
Glucose-Capillary: 264 mg/dL — ABNORMAL HIGH (ref 70–99)
Glucose-Capillary: 351 mg/dL — ABNORMAL HIGH (ref 70–99)
Glucose-Capillary: 418 mg/dL — ABNORMAL HIGH (ref 70–99)
Glucose-Capillary: 68 mg/dL — ABNORMAL LOW (ref 70–99)
Glucose-Capillary: 74 mg/dL (ref 70–99)
Glucose-Capillary: 80 mg/dL (ref 70–99)
Glucose-Capillary: 98 mg/dL (ref 70–99)

## 2011-09-30 LAB — CBC
HCT: 33.8 % — ABNORMAL LOW (ref 36.0–46.0)
HCT: 35.6 % — ABNORMAL LOW (ref 36.0–46.0)
HCT: 37.7 % (ref 36.0–46.0)
HCT: 39.6 % (ref 36.0–46.0)
Hemoglobin: 12.1 g/dL (ref 12.0–15.0)
MCHC: 32.3 g/dL (ref 30.0–36.0)
MCHC: 32.6 g/dL (ref 30.0–36.0)
MCHC: 32.6 g/dL (ref 30.0–36.0)
MCV: 80.5 fL (ref 78.0–100.0)
MCV: 80.8 fL (ref 78.0–100.0)
MCV: 80.9 fL (ref 78.0–100.0)
Platelets: 180 10*3/uL (ref 150–400)
Platelets: 209 10*3/uL (ref 150–400)
RBC: 4.19 MIL/uL (ref 3.87–5.11)
RBC: 4.29 MIL/uL (ref 3.87–5.11)
RBC: 4.42 MIL/uL (ref 3.87–5.11)
RBC: 4.66 MIL/uL (ref 3.87–5.11)
RDW: 13.5 % (ref 11.5–15.5)
RDW: 13.7 % (ref 11.5–15.5)
WBC: 10.7 10*3/uL — ABNORMAL HIGH (ref 4.0–10.5)
WBC: 11.1 10*3/uL — ABNORMAL HIGH (ref 4.0–10.5)
WBC: 17.4 10*3/uL — ABNORMAL HIGH (ref 4.0–10.5)
WBC: 25 10*3/uL — ABNORMAL HIGH (ref 4.0–10.5)

## 2011-09-30 LAB — CARDIAC PANEL(CRET KIN+CKTOT+MB+TROPI)
CK, MB: 37.9 ng/mL — ABNORMAL HIGH (ref 0.3–4.0)
Relative Index: 7.9 — ABNORMAL HIGH (ref 0.0–2.5)
Troponin I: 1.63 ng/mL (ref 0.00–0.06)
Troponin I: 3.35 ng/mL (ref 0.00–0.06)
Troponin I: 4.23 ng/mL (ref 0.00–0.06)

## 2011-09-30 LAB — COMPREHENSIVE METABOLIC PANEL
ALT: 23 U/L (ref 0–35)
AST: 37 U/L (ref 0–37)
Albumin: 3.3 g/dL — ABNORMAL LOW (ref 3.5–5.2)
Alkaline Phosphatase: 52 U/L (ref 39–117)
BUN: 8 mg/dL (ref 6–23)
CO2: 27 mEq/L (ref 19–32)
Chloride: 103 mEq/L (ref 96–112)
Chloride: 105 mEq/L (ref 96–112)
Creatinine, Ser: 0.64 mg/dL (ref 0.4–1.2)
GFR calc Af Amer: >60 mL/min (ref >60)
GFR calc non Af Amer: >60 mL/min (ref >60)
Potassium: 4.2 mEq/L (ref 3.5–5.1)
Sodium: 139 mEq/L (ref 135–145)
Total Bilirubin: 1.1 mg/dL (ref 0.3–1.2)
Total Bilirubin: 1.2 mg/dL (ref 0.3–1.2)
Total Protein: 5.8 g/dL — ABNORMAL LOW (ref 6.0–8.3)

## 2011-09-30 LAB — POCT I-STAT, CHEM 8
BUN: 16 mg/dL (ref 6–23)
Creatinine, Ser: 0.7 mg/dL (ref 0.4–1.2)
Glucose, Bld: 418 mg/dL — ABNORMAL HIGH (ref 70–99)
Hemoglobin: 14.3 g/dL (ref 12.0–15.0)
Potassium: 4.1 mEq/L (ref 3.5–5.1)
TCO2: 24 mmol/L (ref 0–100)

## 2011-09-30 LAB — BASIC METABOLIC PANEL
BUN: 17 mg/dL (ref 6–23)
BUN: 7 mg/dL (ref 6–23)
CO2: 36 mEq/L — ABNORMAL HIGH (ref 19–32)
CO2: 37 mEq/L — ABNORMAL HIGH (ref 19–32)
CO2: 38 mEq/L — ABNORMAL HIGH (ref 19–32)
Calcium: 9.6 mg/dL (ref 8.4–10.5)
Calcium: 9.8 mg/dL (ref 8.4–10.5)
Chloride: 88 mEq/L — ABNORMAL LOW (ref 96–112)
Chloride: 93 mEq/L — ABNORMAL LOW (ref 96–112)
Chloride: 98 mEq/L (ref 96–112)
Creatinine, Ser: 0.73 mg/dL (ref 0.4–1.2)
Creatinine, Ser: 0.91 mg/dL (ref 0.4–1.2)
GFR calc Af Amer: >60 mL/min (ref >60)
GFR calc Af Amer: >60 mL/min (ref >60)
GFR calc Af Amer: >60 mL/min (ref >60)
Glucose, Bld: 169 mg/dL — ABNORMAL HIGH (ref 70–99)
Potassium: 3.5 mEq/L (ref 3.5–5.1)
Potassium: 3.6 mEq/L (ref 3.5–5.1)
Potassium: 3.9 mEq/L (ref 3.5–5.1)
Sodium: 134 mEq/L — ABNORMAL LOW (ref 135–145)
Sodium: 137 mEq/L (ref 135–145)

## 2011-09-30 LAB — DIFFERENTIAL
Basophils Absolute: 0 10*3/uL (ref 0.0–0.1)
Basophils Relative: 0 % (ref 0–1)
Basophils Relative: 0 % (ref 0–1)
Eosinophils Absolute: 0 10*3/uL (ref 0.0–0.7)
Eosinophils Relative: 0 % (ref 0–5)
Lymphocytes Relative: 3 % — ABNORMAL LOW (ref 12–46)
Monocytes Relative: 10 % (ref 3–12)
Monocytes Relative: 4 % (ref 3–12)
Neutro Abs: 23.2 10*3/uL — ABNORMAL HIGH (ref 1.7–7.7)
Neutro Abs: 7.6 10*3/uL (ref 1.7–7.7)
Neutrophils Relative %: 71 % (ref 43–77)

## 2011-09-30 LAB — B-NATRIURETIC PEPTIDE (CONVERTED LAB)
Pro B Natriuretic peptide (BNP): 136 pg/mL — ABNORMAL HIGH (ref 0.0–100.0)
Pro B Natriuretic peptide (BNP): 141 pg/mL — ABNORMAL HIGH (ref 0.0–100.0)
Pro B Natriuretic peptide (BNP): 283 pg/mL — ABNORMAL HIGH (ref 0.0–100.0)
Pro B Natriuretic peptide (BNP): 520 pg/mL — ABNORMAL HIGH (ref 0.0–100.0)

## 2011-09-30 LAB — POCT I-STAT 3, ART BLOOD GAS (G3+)
Acid-base deficit: 8 mmol/L — ABNORMAL HIGH (ref 0.0–2.0)
O2 Saturation: 100 %
TCO2: 21 mmol/L (ref 0–100)
pCO2 arterial: 45.9 mmHg — ABNORMAL HIGH (ref 35.0–45.0)
pO2, Arterial: 558 mmHg — ABNORMAL HIGH (ref 80.0–100.0)

## 2011-09-30 LAB — PROTIME-INR: Prothrombin Time: 19.9 seconds — ABNORMAL HIGH (ref 11.6–15.2)

## 2012-01-19 ENCOUNTER — Encounter: Payer: BC Managed Care – PPO | Admitting: *Deleted

## 2012-06-03 DIAGNOSIS — J449 Chronic obstructive pulmonary disease, unspecified: Secondary | ICD-10-CM

## 2012-07-22 DIAGNOSIS — J449 Chronic obstructive pulmonary disease, unspecified: Secondary | ICD-10-CM

## 2012-07-23 DIAGNOSIS — J449 Chronic obstructive pulmonary disease, unspecified: Secondary | ICD-10-CM

## 2012-07-23 DIAGNOSIS — R748 Abnormal levels of other serum enzymes: Secondary | ICD-10-CM

## 2012-07-24 DIAGNOSIS — J449 Chronic obstructive pulmonary disease, unspecified: Secondary | ICD-10-CM

## 2012-07-24 DIAGNOSIS — I214 Non-ST elevation (NSTEMI) myocardial infarction: Secondary | ICD-10-CM

## 2012-07-26 DIAGNOSIS — I214 Non-ST elevation (NSTEMI) myocardial infarction: Secondary | ICD-10-CM

## 2012-07-26 DIAGNOSIS — I2510 Atherosclerotic heart disease of native coronary artery without angina pectoris: Secondary | ICD-10-CM

## 2012-08-16 DIAGNOSIS — R05 Cough: Secondary | ICD-10-CM

## 2012-12-06 ENCOUNTER — Encounter: Payer: Self-pay | Admitting: Vascular Surgery

## 2013-06-28 DIAGNOSIS — I214 Non-ST elevation (NSTEMI) myocardial infarction: Secondary | ICD-10-CM

## 2013-06-28 DIAGNOSIS — I2510 Atherosclerotic heart disease of native coronary artery without angina pectoris: Secondary | ICD-10-CM

## 2013-06-28 NOTE — H&P (Signed)
Cardiologist: DeGent  HPI:  79 y/o with a history of  HTN, HL, DM2, carotid stenosis, mild dementia and CAD transferred from [facility] for further evaluation of NSTEMI.  She has had multiple prior percutaneous coronary interventions. She had bare-metal stent to all 3 vessels. On Christmas day 2009, she presented with an anterior  infarction with VF arrest and had a new XIENCE stent placed in the LAD. Three days later, she had 3 Endeavor stents placed in the right coronary artery for in-stent restenosis. There were 2 overlapping stents in the proximal and midvessel and distal stent placed. Her last cardiac catheterization was in May of 2010 and demonstrated 50-70% distal circumflex stenosis, patent LAD stents and 50% distal RCA in-stent restenosis. Ejection fraction is 60%.   She is somewhat of a poor historian due to her dementia. She tells me that she does pretty well at baseline. Lives at home with her husband and son. Able to mop floors and got to the store without a problem. Denies exertional angina.   According to the ER doctor at [facility] hospital. She developed CP at about 3pm today. Family took her to [facility]. ECG was non-acute. Initial troponin 0.95. Started on ASA and transferred her. Currently denies CP.     Review of Systems:     Cardiac Review of Systems: {Y] = yes [ ]  = no  Chest Pain [ y   ]  Resting SOB [   ] Exertional SOB  [  ]  Orthopnea [  ]   Pedal Edema [   ]    Palpitations [  ] Syncope  [  ]   Presyncope [   ]  General Review of Systems: [Y] = yes [  ]=no Constitional: recent weight change [  ]; anorexia [  ]; fatigue [  ]; nausea [  ]; night sweats [  ]; fever [  ]; or chills [  ];                                                                                                                                          Eye : blurred vision [  ]; diplopia [   ]; vision changes [  ];  Amaurosis fugax[  ]; Resp: cough [  ];  wheezing[  ];  hemoptysis[  ]; shortness  of breath[  ]; paroxysmal nocturnal dyspnea[  ]; dyspnea on exertion[  ]; or orthopnea[  ];  GI:  gallstones[  ], vomiting[  ];  dysphagia[  ]; melena[  ];  hematochezia [  ]; heartburn[  ];   GU: kidney stones [  ]; hematuria[  ];   dysuria [  ];  nocturia[  ];  history of     obstruction [  ];                 Skin: rash,  swelling[  ];, hair loss[  ];  peripheral edema[  ];  or itching[  ]; Musculosketetal: myalgias[  ];  joint swelling[  ];  joint erythema[  ];  joint pain[ y];  back pain[  ];  Heme/Lymph: bruising[  ];  bleeding[  ];  anemia[  ];  Neuro: TIA[  ];  headaches[  ];  stroke[  ];  vertigo[  ];  seizures[  ];   paresthesias[  ];  difficulty walking[  ];  Psych:depression[  y]; anxiety[  ];  Endocrine: diabetes[y  ];  thyroid dysfunction[  ];  Other:  Past Medical History  Diagnosis Date  . Coronary artery disease     multivessel  . Hypotension   . Peripheral artery disease   . Hypertension     hx  . Diabetes mellitus, type 2   . Dyslipidemia   . Tobacco user     remote  . Stroke     Medications Prior to Admission  Medication Sig Dispense Refill  . aspirin 325 MG EC tablet Take 325 mg by mouth daily.        . citalopram (CELEXA) 20 MG tablet Take 20 mg by mouth daily.        . clopidogrel (PLAVIX) 75 MG tablet Take 75 mg by mouth daily.        . furosemide (LASIX) 20 MG tablet Take 20 mg by mouth 3 (three) times a week.        Marland Kitchen glipiZIDE (GLUCOTROL) 10 MG tablet Take 10 mg by mouth daily.        . metoprolol tartrate (LOPRESSOR) 25 MG tablet Take 25 mg by mouth 2 (two) times daily. Take 1/2 tab       . nitroGLYCERIN (NITROSTAT) 0.4 MG SL tablet Place 0.4 mg under the tongue every 5 (five) minutes as needed.        . ramipril (ALTACE) 5 MG capsule Take 5 mg by mouth 2 (two) times daily.        . rosuvastatin (CRESTOR) 10 MG tablet Take 10 mg by mouth daily.           Allergies  Allergen Reactions  . Atorvastatin     REACTION: myalgia  . Penicillins      REACTION: rash    History   Social History  . Marital Status: Widowed    Spouse Name: N/A    Number of Children: N/A  . Years of Education: N/A   Occupational History  . Not on file.   Social History Main Topics  . Smoking status: Never Smoker   . Smokeless tobacco: Not on file  . Alcohol Use: No  . Drug Use: No  . Sexually Active: Not on file   Other Topics Concern  . Not on file   Social History Narrative  . No narrative on file    FHX: Positive for CAD in multiple family members.  PHYSICAL EXAM: Filed Vitals:   06/29/13 0033  BP: 128/65  Pulse: 57  Temp: 97.6 F (36.4 C)  Resp: 18   General:  Thin elderly. No acute distess HEENT: normal Neck: supple. no JVD. Carotids 2+ bilat;+ R bruit. No lymphadenopathy or thryomegaly appreciated. Cor: PMI nondisplaced. Regular rate & rhythm. No rubs, gallops or murmurs. Lungs: clear Abdomen: soft, nontender, nondistended. No hepatosplenomegaly. No bruits or masses. Good bowel sounds. Extremities: no cyanosis, clubbing, rash, edema Neuro: alert & oriented to person and place. Unable to tell me year or name the  President. cranial nerves grossly intact. moves all 4 extremities w/o difficulty. Affect pleasant.  ECG: NSR 64. Anteroseptal Qs. No acute ST-T wave abnormalities  No results found for this or any previous visit (from the past 24 hour(s)). No results found.  Labs from [facility]:   Hgb 12.4 Hct 39.1 PLT 192 WBC 10.6 Na 136 K 3.7 Bun 15 Cr 0.8 Albumin 4.1 BNP 211 D-dimer 0.6 Ck 111 Mb 5.6 Trop 0.95 INR 1.1  ASSESSMENT:  1) NSTEMI, small 2) CAD s/p multiple PCIs 3) Dementia 4) Carotid stenosis     --last u/s 7/12: R 60-79% L 40-59% 5) HL 6) DM2  PLAN/DISCUSSION:  She is now CP free. Will treat with ASA, heparin, b-blocker, Plavix and statin. Cycle cardiac markers. Will need to discuss cath versus medical management with family.  Valari Taylor,MD 12:56 AM

## 2013-06-29 ENCOUNTER — Inpatient Hospital Stay (HOSPITAL_COMMUNITY)
Admission: AD | Admit: 2013-06-29 | Discharge: 2013-07-06 | DRG: 280 | Disposition: A | Discharge status: Skilled Nursing Facility | Payer: Medicare Other | Source: Other Acute Inpatient Hospital | Attending: Internal Medicine | Admitting: Internal Medicine

## 2013-06-29 ENCOUNTER — Encounter (HOSPITAL_COMMUNITY): Payer: Self-pay | Admitting: General Practice

## 2013-06-29 DIAGNOSIS — I509 Heart failure, unspecified: Secondary | ICD-10-CM | POA: Diagnosis present

## 2013-06-29 DIAGNOSIS — J4489 Other specified chronic obstructive pulmonary disease: Secondary | ICD-10-CM | POA: Diagnosis present

## 2013-06-29 DIAGNOSIS — Z87891 Personal history of nicotine dependence: Secondary | ICD-10-CM

## 2013-06-29 DIAGNOSIS — I214 Non-ST elevation (NSTEMI) myocardial infarction: Principal | ICD-10-CM

## 2013-06-29 DIAGNOSIS — T82897A Other specified complication of cardiac prosthetic devices, implants and grafts, initial encounter: Secondary | ICD-10-CM | POA: Diagnosis present

## 2013-06-29 DIAGNOSIS — I472 Ventricular tachycardia, unspecified: Secondary | ICD-10-CM | POA: Diagnosis not present

## 2013-06-29 DIAGNOSIS — Z79899 Other long term (current) drug therapy: Secondary | ICD-10-CM

## 2013-06-29 DIAGNOSIS — F0391 Unspecified dementia with behavioral disturbance: Secondary | ICD-10-CM

## 2013-06-29 DIAGNOSIS — I4729 Other ventricular tachycardia: Secondary | ICD-10-CM | POA: Diagnosis not present

## 2013-06-29 DIAGNOSIS — Z8249 Family history of ischemic heart disease and other diseases of the circulatory system: Secondary | ICD-10-CM

## 2013-06-29 DIAGNOSIS — I739 Peripheral vascular disease, unspecified: Secondary | ICD-10-CM | POA: Diagnosis present

## 2013-06-29 DIAGNOSIS — Z9861 Coronary angioplasty status: Secondary | ICD-10-CM

## 2013-06-29 DIAGNOSIS — E872 Acidosis, unspecified: Secondary | ICD-10-CM | POA: Diagnosis present

## 2013-06-29 DIAGNOSIS — Z8673 Personal history of transient ischemic attack (TIA), and cerebral infarction without residual deficits: Secondary | ICD-10-CM

## 2013-06-29 DIAGNOSIS — F0390 Unspecified dementia without behavioral disturbance: Secondary | ICD-10-CM

## 2013-06-29 DIAGNOSIS — R1310 Dysphagia, unspecified: Secondary | ICD-10-CM

## 2013-06-29 DIAGNOSIS — R0600 Dyspnea, unspecified: Secondary | ICD-10-CM

## 2013-06-29 DIAGNOSIS — Z880 Allergy status to penicillin: Secondary | ICD-10-CM

## 2013-06-29 DIAGNOSIS — F03918 Unspecified dementia, unspecified severity, with other behavioral disturbance: Secondary | ICD-10-CM | POA: Diagnosis present

## 2013-06-29 DIAGNOSIS — E119 Type 2 diabetes mellitus without complications: Secondary | ICD-10-CM

## 2013-06-29 DIAGNOSIS — R64 Cachexia: Secondary | ICD-10-CM | POA: Diagnosis present

## 2013-06-29 DIAGNOSIS — I959 Hypotension, unspecified: Secondary | ICD-10-CM

## 2013-06-29 DIAGNOSIS — Z7982 Long term (current) use of aspirin: Secondary | ICD-10-CM

## 2013-06-29 DIAGNOSIS — Z66 Do not resuscitate: Secondary | ICD-10-CM | POA: Diagnosis present

## 2013-06-29 DIAGNOSIS — Y831 Surgical operation with implant of artificial internal device as the cause of abnormal reaction of the patient, or of later complication, without mention of misadventure at the time of the procedure: Secondary | ICD-10-CM | POA: Diagnosis present

## 2013-06-29 DIAGNOSIS — E785 Hyperlipidemia, unspecified: Secondary | ICD-10-CM | POA: Diagnosis present

## 2013-06-29 DIAGNOSIS — I10 Essential (primary) hypertension: Secondary | ICD-10-CM

## 2013-06-29 DIAGNOSIS — Z8674 Personal history of sudden cardiac arrest: Secondary | ICD-10-CM

## 2013-06-29 DIAGNOSIS — R451 Restlessness and agitation: Secondary | ICD-10-CM

## 2013-06-29 DIAGNOSIS — IMO0002 Reserved for concepts with insufficient information to code with codable children: Secondary | ICD-10-CM

## 2013-06-29 DIAGNOSIS — Z7902 Long term (current) use of antithrombotics/antiplatelets: Secondary | ICD-10-CM

## 2013-06-29 DIAGNOSIS — I2510 Atherosclerotic heart disease of native coronary artery without angina pectoris: Secondary | ICD-10-CM | POA: Diagnosis present

## 2013-06-29 DIAGNOSIS — E7800 Pure hypercholesterolemia, unspecified: Secondary | ICD-10-CM | POA: Diagnosis present

## 2013-06-29 DIAGNOSIS — I5033 Acute on chronic diastolic (congestive) heart failure: Secondary | ICD-10-CM | POA: Diagnosis not present

## 2013-06-29 DIAGNOSIS — I6529 Occlusion and stenosis of unspecified carotid artery: Secondary | ICD-10-CM | POA: Diagnosis present

## 2013-06-29 DIAGNOSIS — J449 Chronic obstructive pulmonary disease, unspecified: Secondary | ICD-10-CM

## 2013-06-29 DIAGNOSIS — Z515 Encounter for palliative care: Secondary | ICD-10-CM

## 2013-06-29 HISTORY — DX: Chronic obstructive pulmonary disease, unspecified: J44.9

## 2013-06-29 HISTORY — DX: Unspecified dementia, unspecified severity, without behavioral disturbance, psychotic disturbance, mood disturbance, and anxiety: F03.90

## 2013-06-29 LAB — GLUCOSE, CAPILLARY
Glucose-Capillary: 113 mg/dL — ABNORMAL HIGH (ref 70–99)
Glucose-Capillary: 142 mg/dL — ABNORMAL HIGH (ref 70–99)

## 2013-06-29 LAB — CBC
HCT: 32.3 % — ABNORMAL LOW (ref 36.0–46.0)
Hemoglobin: 10.6 g/dL — ABNORMAL LOW (ref 12.0–15.0)
MCV: 79.8 fL (ref 78.0–100.0)
RDW: 13 % (ref 11.5–15.5)
WBC: 7.5 10*3/uL (ref 4.0–10.5)

## 2013-06-29 LAB — LIPID PANEL
HDL: 39 mg/dL — ABNORMAL LOW (ref >39)
Triglycerides: 233 mg/dL — ABNORMAL HIGH (ref <150)

## 2013-06-29 LAB — TROPONIN I
Troponin I: 0.93 ng/mL (ref <0.30)
Troponin I: 0.36 ng/mL (ref <0.30)

## 2013-06-29 LAB — HEPARIN LEVEL (UNFRACTIONATED): Heparin Unfractionated: 0.16 IU/mL — ABNORMAL LOW (ref 0.30–0.70)

## 2013-06-29 MED ORDER — CITALOPRAM HYDROBROMIDE 20 MG PO TABS
20.0000 mg | ORAL_TABLET | Freq: Every day | ORAL | Status: DC
  Administered 2013-06-29 – 2013-07-06 (×8): 20 mg via ORAL
  Filled 2013-06-29 (×9): qty 1

## 2013-06-29 MED ORDER — FUROSEMIDE 20 MG PO TABS
20.0000 mg | ORAL_TABLET | ORAL | Status: DC
  Administered 2013-07-01 – 2013-07-05 (×3): 20 mg via ORAL
  Filled 2013-06-29 (×3): qty 1

## 2013-06-29 MED ORDER — CLOPIDOGREL BISULFATE 75 MG PO TABS
75.0000 mg | ORAL_TABLET | Freq: Every day | ORAL | Status: DC
  Administered 2013-06-29 – 2013-07-06 (×8): 75 mg via ORAL
  Filled 2013-06-29 (×10): qty 1

## 2013-06-29 MED ORDER — ASPIRIN 300 MG RE SUPP
300.0000 mg | RECTAL | Status: AC
  Filled 2013-06-29: qty 1

## 2013-06-29 MED ORDER — ACETAMINOPHEN 325 MG PO TABS: 650.0000 mg | ORAL_TABLET | ORAL | Status: DC | PRN

## 2013-06-29 MED ORDER — HEPARIN (PORCINE) IN NACL 100-0.45 UNIT/ML-% IJ SOLN
800.0000 [IU]/h | INTRAMUSCULAR | Status: DC
  Administered 2013-06-29: 800 [IU]/h via INTRAVENOUS

## 2013-06-29 MED ORDER — ROSUVASTATIN CALCIUM 10 MG PO TABS
10.0000 mg | ORAL_TABLET | Freq: Every day | ORAL | Status: DC
  Administered 2013-06-29 – 2013-07-05 (×7): 10 mg via ORAL
  Filled 2013-06-29 (×9): qty 1

## 2013-06-29 MED ORDER — RAMIPRIL 5 MG PO CAPS
5.0000 mg | ORAL_CAPSULE | Freq: Two times a day (BID) | ORAL | Status: DC
  Administered 2013-06-29 – 2013-07-05 (×11): 5 mg via ORAL
  Filled 2013-06-29 (×18): qty 1

## 2013-06-29 MED ORDER — GLIPIZIDE 10 MG PO TABS
10.0000 mg | ORAL_TABLET | Freq: Every day | ORAL | Status: DC
  Administered 2013-06-29 – 2013-07-06 (×6): 10 mg via ORAL
  Filled 2013-06-29 (×11): qty 1

## 2013-06-29 MED ORDER — ASPIRIN 81 MG PO CHEW: 324.0000 mg | CHEWABLE_TABLET | ORAL | Status: AC

## 2013-06-29 MED ORDER — ONDANSETRON HCL 4 MG/2ML IJ SOLN: 4.0000 mg | Freq: Four times a day (QID) | INTRAMUSCULAR | Status: DC | PRN

## 2013-06-29 MED ORDER — NITROGLYCERIN 0.4 MG SL SUBL: 0.4000 mg | SUBLINGUAL_TABLET | SUBLINGUAL | Status: DC | PRN

## 2013-06-29 MED ORDER — ENOXAPARIN SODIUM 30 MG/0.3ML ~~LOC~~ SOLN
30.0000 mg | SUBCUTANEOUS | Status: DC
  Administered 2013-06-29 – 2013-06-30 (×2): 30 mg via SUBCUTANEOUS
  Filled 2013-06-29 (×3): qty 0.3

## 2013-06-29 MED ORDER — METOPROLOL TARTRATE 25 MG PO TABS
25.0000 mg | ORAL_TABLET | Freq: Two times a day (BID) | ORAL | Status: DC
  Administered 2013-06-29 – 2013-06-30 (×4): 25 mg via ORAL
  Filled 2013-06-29 (×7): qty 1

## 2013-06-29 MED ORDER — NON FORMULARY: 10.0000 mg | Freq: Every day | Status: DC

## 2013-06-29 MED ORDER — ASPIRIN EC 81 MG PO TBEC
81.0000 mg | DELAYED_RELEASE_TABLET | Freq: Every day | ORAL | Status: DC
  Administered 2013-06-30 – 2013-07-06 (×7): 81 mg via ORAL
  Filled 2013-06-29 (×8): qty 1

## 2013-06-29 MED ORDER — HEPARIN (PORCINE) IN NACL 100-0.45 UNIT/ML-% IJ SOLN
700.0000 [IU]/h | INTRAMUSCULAR | Status: DC
  Administered 2013-06-29: 700 [IU]/h via INTRAVENOUS
  Filled 2013-06-29: qty 250

## 2013-06-29 NOTE — Progress Notes (Signed)
ANTICOAGULATION CONSULT NOTE - Initial Consult  Pharmacy Consult for heparin Indication: NSTEMI  Allergies  Allergen Reactions  . Atorvastatin     REACTION: myalgia  . Penicillins     REACTION: rash    Patient Measurements: Height: 5\' 6"  (167.6 cm) Weight: 131 lb 3.2 oz (59.512 kg) IBW/kg (Calculated) : 59.3  Vital Signs: Temp: 97.6 F (36.4 C) (07/05 0033) Temp src: Oral (07/05 0033) BP: 128/65 mmHg (07/05 0033) Pulse Rate: 57 (07/05 0033)  Labs: Estimated Creatinine Clearance: 54.3 ml/min (by C-G formula based on Cr of 0.64).   Medical History: Past Medical History  Diagnosis Date  . Coronary artery disease     multivessel  . Hypotension   . Peripheral artery disease   . Hypertension     hx  . Diabetes mellitus, type 2   . Dyslipidemia   . Tobacco user     remote  . Stroke     Medications:  Prescriptions prior to admission  Medication Sig Dispense Refill  . aspirin 325 MG EC tablet Take 325 mg by mouth daily.        . citalopram (CELEXA) 20 MG tablet Take 20 mg by mouth daily.        . clopidogrel (PLAVIX) 75 MG tablet Take 75 mg by mouth daily.        . furosemide (LASIX) 20 MG tablet Take 20 mg by mouth 3 (three) times a week.        Marland Kitchen glipiZIDE (GLUCOTROL) 10 MG tablet Take 10 mg by mouth daily.        . metoprolol tartrate (LOPRESSOR) 25 MG tablet Take 25 mg by mouth 2 (two) times daily. Take 1/2 tab       . nitroGLYCERIN (NITROSTAT) 0.4 MG SL tablet Place 0.4 mg under the tongue every 5 (five) minutes as needed.        . ramipril (ALTACE) 5 MG capsule Take 5 mg by mouth 2 (two) times daily.        . rosuvastatin (CRESTOR) 10 MG tablet Take 10 mg by mouth daily.         Scheduled:  . aspirin  324 mg Oral NOW   Or  . aspirin  300 mg Rectal NOW  . [START ON 06/30/2013] aspirin EC  81 mg Oral Daily    Assessment: 79yo female w/ significant cardiac hx c/o CP, transferred from [facility] for elevated troponin, to be further evaluated for NSTEMI, to  continue heparin already started.  Goal of Therapy:  Heparin level 0.3-0.7 units/ml Monitor platelets by anticoagulation protocol: Yes   Plan:  [facility] EDP gave heparin 3000 units IV bolus followed by gtt at 700 units/hr; will continue for now and monitor heparin levels and CBC.  Vernard Gambles, PharmD, BCPS  06/29/2013,1:02 AM

## 2013-06-29 NOTE — Progress Notes (Signed)
Pt and family refused having an IV placed tonight. MD on call made aware. Will cont to monitor pt.

## 2013-06-29 NOTE — Progress Notes (Signed)
[name] is a 79 y.o. female the history of CAD, status post multiple PCI procedures in the past who was admitted last night with a non-STEMI. She was transferred from [facility]. Initial troponin was 0.95. Troponin here 0.93. She remains on IV heparin. She is currently pain-free. Her family had questions as to her plan of care. I met with the patient and her family. The patient remains pain-free. We discussed all aspects of her care including medical therapy versus possibly proceeding with cardiac catheterization.  She will be seen again tomorrow morning on rounds. Tereso Newcomer, PA-C   06/29/2013 1:19 PM

## 2013-06-29 NOTE — Plan of Care (Signed)
Problem: Consults Goal: Tobacco Cessation referral if indicated Outcome: Not Applicable Date Met:  06/29/13 Pt quit smoking yrs ago.  Problem: Phase I Progression Outcomes Goal: Aspirin unless contraindicated Outcome: Completed/Met Date Met:  06/29/13 Pt took 2 81 mg ASA at home prior to arrival & took 2 more ASA in the Ed at [employer]

## 2013-06-29 NOTE — Progress Notes (Signed)
Pt has a positive troponin of 0.93. Previous troponin at [facility] was 0.95. Pt currently on heparin drip. Statin ordered & ASA already given. Pt asymptomatic & VS stable. Will continue to monitor the pt. Sanda Linger

## 2013-06-29 NOTE — Progress Notes (Signed)
ANTICOAGULATION CONSULT NOTE - Follow Up Consult  Pharmacy Consult:  Heparin Indication:  NSTEMI  Allergies  Allergen Reactions  . Atorvastatin     REACTION: myalgia  . Penicillins     REACTION: rash    Patient Measurements: Height: 5\' 6"  (167.6 cm) Weight: 131 lb 3.2 oz (59.512 kg) IBW/kg (Calculated) : 59.3 Heparin Dosing Weight: 60 kg  Vital Signs: Temp: 97.4 F (36.3 C) (07/05 0536) Temp src: Oral (07/05 0536) BP: 90/50 mmHg (07/05 0536) Pulse Rate: 51 (07/05 0536)  Labs:  Recent Labs  06/29/13 0200 06/29/13 0725  HEPARINUNFRC  --  0.16*  TROPONINI 0.93*  --     Estimated Creatinine Clearance: 54.3 ml/min (by C-G formula based on Cr of 0.64).     Assessment: 48 YOF admitted to [facility] with complaint of CP, found to have elevated troponin and started on heparin, then transferred to [facility] for further management.  Patient continues on IV heparin for NSTEMI.  Heparin level sub-therapeutic; no bleeding reported.  RNs reported that IV was beeping since they arrive around 0700, but they are unsure how long it had been beeping and how much heparin the patient actually received.  Goal of Therapy:  Heparin level 0.3-0.7 units/ml Monitor platelets by anticoagulation protocol: Yes    Plan:  - Increase heparin gtt slightly to 800 units/hr - Check 8 hr HL - Daily HL / CBC     Sadao Weyer D. Laney Potash, PharmD, BCPS Pager:  [phone] 06/29/2013, 8:52 AM

## 2013-06-30 LAB — BASIC METABOLIC PANEL
BUN: 9 mg/dL (ref 6–23)
CO2: 33 mEq/L — ABNORMAL HIGH (ref 19–32)
Chloride: 100 mEq/L (ref 96–112)
Creatinine, Ser: 0.74 mg/dL (ref 0.50–1.10)
GFR calc Af Amer: >90 mL/min (ref >90)

## 2013-06-30 LAB — CBC
HCT: 36 % (ref 36.0–46.0)
Hemoglobin: 11.7 g/dL — ABNORMAL LOW (ref 12.0–15.0)
MCH: 25.9 pg — ABNORMAL LOW (ref 26.0–34.0)
MCHC: 32.5 g/dL (ref 30.0–36.0)
MCV: 79.8 fL (ref 78.0–100.0)
Platelets: 176 10*3/uL (ref 150–400)
RBC: 4.51 MIL/uL (ref 3.87–5.11)
RDW: 13.1 % (ref 11.5–15.5)
WBC: 6.4 10*3/uL (ref 4.0–10.5)

## 2013-06-30 LAB — GLUCOSE, CAPILLARY
Glucose-Capillary: 171 mg/dL — ABNORMAL HIGH (ref 70–99)
Glucose-Capillary: 72 mg/dL (ref 70–99)

## 2013-06-30 MED ORDER — HALOPERIDOL 1 MG PO TABS
1.0000 mg | ORAL_TABLET | Freq: Three times a day (TID) | ORAL | Status: DC | PRN
  Filled 2013-06-30: qty 1

## 2013-06-30 MED ORDER — LORAZEPAM 1 MG PO TABS
1.0000 mg | ORAL_TABLET | ORAL | Status: DC | PRN
  Administered 2013-06-30 – 2013-07-02 (×3): 1 mg via ORAL
  Filled 2013-06-30 (×4): qty 1

## 2013-06-30 NOTE — Progress Notes (Signed)
Patient ID: [name], female   DOB: 1933/07/20, 79 y.o.   MRN: [id number]    Subjective:  Denies SSCP, palpitations or Dyspnea Nurse indicates severe agitation and has pulled out all iv's    Objective:  Filed Vitals:   06/29/13 1000 06/29/13 1500 06/29/13 2100 06/30/13 0544  BP: 96/54 96/50 107/61 126/70  Pulse: 53 62 68 67  Temp:  98 F (36.7 C) 97.8 F (36.6 C) 98.2 F (36.8 C)  TempSrc:   Oral Oral  Resp:  16 18 18   Height:      Weight:    130 lb 12.8 oz (59.33 kg)  SpO2:  94% 92% 92%    Intake/Output from previous day:  Intake/Output Summary (Last 24 hours) at 06/30/13 1027 Last data filed at 06/29/13 1200  Gross per 24 hour  Intake    360 ml  Output      0 ml  Net    360 ml    Physical Exam: Affect appropriate Elderly frail female HEENT: normal Neck supple with no adenopathy JVP normal no bruits no thyromegaly Lungs clear with no wheezing and good diaphragmatic motion Heart:  S1/S2 SEM  murmur, no rub, gallop or click PMI normal Abdomen: benighn, BS positve, no tenderness, no AAA no bruit.  No HSM or HJR Distal pulses intact with no bruits No edema Neuro non-focal Skin warm and dry No muscular weakness   CBC:  Recent Labs  06/29/13 0745 06/30/13 0515  WBC 7.5 6.4  HGB 10.6* 11.7*  HCT 32.3* 36.0  MCV 79.8 79.8  PLT 163 176   Cardiac Enzymes:  Recent Labs  06/29/13 0200 06/29/13 1342 06/29/13 1929  TROPONINI 0.93* 0.36* 0.98*   Fasting Lipid Panel:  Recent Labs  06/29/13 0200  CHOL 194  HDL 39*  LDLCALC 108*  TRIG 233*  CHOLHDL 5.0   Imaging: No results found.  Cardiac Studies:  ECG: NSR rate 64  normal   Telemetry:  NSR no VT 06/30/2013   Echo:   Medications:   . aspirin EC  81 mg Oral Daily  . citalopram  20 mg Oral Daily  . clopidogrel  75 mg Oral Q breakfast  . enoxaparin (LOVENOX) injection  30 mg Subcutaneous Q24H  . [START ON 07/01/2013] furosemide  20 mg Oral 3 times weekly  . glipiZIDE  10 mg Oral QAC  breakfast  . metoprolol tartrate  25 mg Oral BID  . ramipril  5 mg Oral BID  . rosuvastatin  10 mg Oral Daily       Assessment/Plan:  Chest Pain:  She is not a candidate for aggressive w/u with advanced dementia.  She is chest pain free with minimal elevation in troponin and normal ECG Will try to order lexiscan myovue tomorrow ( unable to do today) Do not restart iv till am as she has taken 3 out already.  Will need ativan, haldol and sitter for  Patient safety DM:  Continue glipizide Chol:  Continue statin  Charlton Haws 06/30/2013, 9:06 AM

## 2013-07-01 ENCOUNTER — Inpatient Hospital Stay (HOSPITAL_COMMUNITY): Payer: Medicare Other

## 2013-07-01 DIAGNOSIS — I959 Hypotension, unspecified: Secondary | ICD-10-CM

## 2013-07-01 DIAGNOSIS — I517 Cardiomegaly: Secondary | ICD-10-CM

## 2013-07-01 DIAGNOSIS — F0390 Unspecified dementia without behavioral disturbance: Secondary | ICD-10-CM

## 2013-07-01 LAB — BLOOD GAS, ARTERIAL
Expiratory PAP: 6
Inspiratory PAP: 12
Mode: POSITIVE
O2 Saturation: 96.8 %
pCO2 arterial: 76.9 mmHg (ref 35.0–45.0)
pO2, Arterial: 97.6 mmHg (ref 80.0–100.0)

## 2013-07-01 LAB — PRO B NATRIURETIC PEPTIDE: Pro B Natriuretic peptide (BNP): 7432 pg/mL — ABNORMAL HIGH (ref 0–450)

## 2013-07-01 LAB — CBC
MCHC: 32.2 g/dL (ref 30.0–36.0)
Platelets: 210 10*3/uL (ref 150–400)
RDW: 13.2 % (ref 11.5–15.5)
WBC: 21.8 10*3/uL — ABNORMAL HIGH (ref 4.0–10.5)

## 2013-07-01 LAB — MRSA PCR SCREENING: MRSA by PCR: NEGATIVE

## 2013-07-01 MED ORDER — LEVALBUTEROL HCL 0.63 MG/3ML IN NEBU
0.6300 mg | INHALATION_SOLUTION | Freq: Once | RESPIRATORY_TRACT | Status: AC
  Administered 2013-07-01: 0.63 mg via RESPIRATORY_TRACT

## 2013-07-01 MED ORDER — METOPROLOL TARTRATE 25 MG PO TABS
25.0000 mg | ORAL_TABLET | Freq: Two times a day (BID) | ORAL | Status: DC
  Administered 2013-07-02 – 2013-07-06 (×8): 25 mg via ORAL
  Filled 2013-07-01 (×12): qty 1

## 2013-07-01 MED ORDER — ENOXAPARIN SODIUM 60 MG/0.6ML ~~LOC~~ SOLN
1.0000 mg/kg | Freq: Two times a day (BID) | SUBCUTANEOUS | Status: DC
  Administered 2013-07-01 – 2013-07-02 (×4): 60 mg via SUBCUTANEOUS
  Filled 2013-07-01 (×7): qty 0.6

## 2013-07-01 MED ORDER — METOPROLOL TARTRATE 1 MG/ML IV SOLN
2.5000 mg | Freq: Once | INTRAVENOUS | Status: AC
  Administered 2013-07-01: 2.5 mg via INTRAVENOUS

## 2013-07-01 MED ORDER — METOPROLOL TARTRATE 1 MG/ML IV SOLN
INTRAVENOUS | Status: AC
  Filled 2013-07-01: qty 5

## 2013-07-01 MED ORDER — FUROSEMIDE 10 MG/ML IJ SOLN
INTRAMUSCULAR | Status: AC
  Administered 2013-07-01: 40 mg
  Filled 2013-07-01: qty 4

## 2013-07-01 MED ORDER — ALBUTEROL SULFATE (5 MG/ML) 0.5% IN NEBU
INHALATION_SOLUTION | RESPIRATORY_TRACT | Status: AC
  Filled 2013-07-01: qty 0.5

## 2013-07-01 MED FILL — Heparin Sodium (Porcine) 100 Unt/ML in Sodium Chloride 0.45%: INTRAMUSCULAR | Qty: 250 | Status: AC

## 2013-07-01 NOTE — Progress Notes (Addendum)
Patient Name: [name]      SUBJECTIVE: events of last pm noted CHF-acute +/- COPD repsonded to BiPAP and diuretics We reviewed history and family ideas re aggresiveness of care.  They are of mixed opinion__son "do whatever is needed" daughter "dont know what how much more to put her through" She is DNR;  At home she is Ox1 only and here has been agitated and confused Currently denies sob and chest pain She is hypotensive this am  Past Medical History  Diagnosis Date  . Coronary artery disease     multivessel  . Hypotension   . Peripheral artery disease   . Hypertension     hx  . Diabetes mellitus, type 2   . Dyslipidemia   . Tobacco user     remote  . Stroke   . COPD (chronic obstructive pulmonary disease)   . Dementia     Scheduled Meds:  Scheduled Meds: . albuterol      . aspirin EC  81 mg Oral Daily  . citalopram  20 mg Oral Daily  . clopidogrel  75 mg Oral Q breakfast  . enoxaparin (LOVENOX) injection  30 mg Subcutaneous Q24H  . furosemide  20 mg Oral 3 times weekly  . glipiZIDE  10 mg Oral QAC breakfast  . metoprolol      . metoprolol tartrate  25 mg Oral BID  . ramipril  5 mg Oral BID  . rosuvastatin  10 mg Oral Daily   Continuous Infusions:   PHYSICAL EXAM Filed Vitals:   07/01/13 0508 07/01/13 0511 07/01/13 0751 07/01/13 0800  BP: 74/38 103/56  89/46  Pulse: 76 77 74   Temp:      TempSrc:      Resp: 19 19 22    Height:      Weight:      SpO2: 100% 100% 97%    Well developed and cachectic jsut transitioned from BiPAP>>Venturi mask HENT normal Neck supple with JVP-flat Clear-laterallty Regular rate and rhythm, no murmurs or gallops Abd-soft with active BS No Clubbing cyanosis edema Skin-warm and dry A & Oriented  Grossly normal sensory and motor function  TELEMETRY: Reviewed telemetry pt in  freq ventricualr and atrial ectopy with some nonsustained VT/SVT   Intake/Output Summary (Last 24 hours) at 07/01/13 0816 Last data filed at  07/01/13 0600  Gross per 24 hour  Intake    240 ml  Output    675 ml  Net   -435 ml    LABS: Basic Metabolic Panel:  Recent Labs Lab 06/30/13 1045  NA 139  K 3.9  CL 100  CO2 33*  GLUCOSE 192*  BUN 9  CREATININE 0.74  CALCIUM 9.9   Cardiac Enzymes:  Recent Labs  06/29/13 1342 06/29/13 1929 07/01/13 0245  TROPONINI 0.36* 0.98* 0.60*   CBC:  Recent Labs Lab 06/29/13 0745 06/30/13 0515 07/01/13 0240  WBC 7.5 6.4 21.8*  HGB 10.6* 11.7* 13.6  HCT 32.3* 36.0 42.2  MCV 79.8 79.8 81.5  PLT 163 176 210   PROTIME: No results found for this basename: LABPROT, INR,  in the last 72 hours Liver Function Tests: No results found for this basename: AST, ALT, ALKPHOS, BILITOT, PROT, ALBUMIN,  in the last 72 hours No results found for this basename: LIPASE, AMYLASE,  in the last 72 hours BNP: BNP (last 3 results)  Recent Labs  07/01/13 0245  PROBNP 7432.0*   D-Dimer: No results found for this basename: DDIMER,  in  the last 72 hours Hemoglobin A1C: No results found for this basename: HGBA1C,  in the last 72 hours Fasting Lipid Panel:  Recent Labs  06/29/13 0200  CHOL 194  HDL 39*  LDLCALC 108*  TRIG 233*  CHOLHDL 5.0    ASSESSMENT AND PLAN:  Principal Problem:   NSTEMI (non-ST elevated myocardial infarction) Active Problems:   Dementia   Hypotension Acute respiratory faillure\\ Acute chronic diastolic heart failure    The pt situation is a challenge with dementia-progressive informing what to do next.  The family is currently ambivalent , but do not wantstress test today. We discussed these things for aobut 30 min Her hypotension is also concerning although she is conversant this am so...  For now Cancel myoview Echo Serial ez Change LMWH>>aACS dose Will review cath over the next 24 -48 hrs Prior cardiologist is GD so no guidance there  Signed, Sherryl Manges MD  07/01/2013

## 2013-07-01 NOTE — Progress Notes (Signed)
Assumed care at this time, SBAR report received from [employer] Pt resting comfortably in bed, denies pain or discomfort. Monitoring closely.

## 2013-07-01 NOTE — Care Management Note (Addendum)
    Page 1 of 1   07/03/2013     10:08:35 AM   CARE MANAGEMENT NOTE 07/03/2013  Patient:  [name], [name]   Account Number:  [id number]  Date Initiated:  07/01/2013  Documentation initiated by:  Junius Creamer  Subjective/Objective Assessment:   adm w mi     Action/Plan:   lives w fam, pcp dr Aurther Loft daniel   Anticipated DC Date:     Anticipated DC Plan:  SKILLED NURSING FACILITY  In-house referral  Clinical Social Worker  Hospice / Palliative Care      DC Planning Services  CM consult      Choice offered to / List presented to:             Status of service:   Medicare Important Message given?   (If response is "NO", the following Medicare IM given date fields will be blank) Date Medicare IM given:   Date Additional Medicare IM given:    Discharge Disposition:    Per UR Regulation:  Reviewed for med. necessity/level of care/duration of stay  If discussed at Long Length of Stay Meetings, dates discussed:    Comments:  7/9 1005a debbie Avalynn Bowe rn,bsn thn nse had spoken w fam on 7-8. spoke w da that cares for pt 12hrs per day and pt's 80+yr old sister provides other 12hrs. they have been doing this for last 4 months. sister w health problems and da has child at home and they feel they cannot continue to care for her at home. hx of locked unit due to dementia but da and sister took her out but cannot cont. pal care consult has been placed and sw consult have been placed also. will cont to follow.

## 2013-07-01 NOTE — Progress Notes (Signed)
CTSP secondary to severe agitation, SOB and hypertension.  The patient was doing well and got up to go to the bathroom and went back to sit down and complained of severe SOB.  She was gasping for air.  O2 sats dropped into the mid 80's and 50% FM was placed.  BP was noted at 194/164mmHg with heart rate 130bpm in sinus tachycardia.  She was noted to have poor air movement throughout and was given a Xopenex treatment.  Patient had no IV at the time and an IV was placed and she was given IV Lopressor 2.5mg .  She was started on BiPAP and 50% O2.  PCXRAY showed increased vascular markings c/w CHF.  She was given lasix 40mg  IV.  She will be transferred to the step down unit.

## 2013-07-01 NOTE — Progress Notes (Signed)
RN assisted pt to [facility]. Pt became diaphoretic and complained of severe SOB. O2 sat 82% on 2L, BP 198/107, HR 120's. Pt placed on Venti mask 50% FM. O2 sat 89%, BP 194/120, HR 130's. Rapid Response, Respiratory and MD on-call notified. New orders received. Xopenex treatment given. Pt had no IV access, once IV access was obtained  IV lopressor 2.5mg  given. Pt started on BiPAP. Portable CXR obtained. IV lasix 40mg  given. Pt transferred to 2917.

## 2013-07-01 NOTE — Significant Event (Signed)
Rapid Response Event Note Resp distress Overview: Time Called: 0133 Arrival Time: 0134 Event Type: Respiratory  Initial Focused Assessment: Called by RT to see pt for resp. Distress.  On assessment pt with crackles throughout, diaphoretic, lethargic, Sats  85% VM. Pt had NO Iv access at onset of event  Interventions: Bipap PCXR ABG IV access Lasix 40mg  IV Lopressor 2.5mg  IV  Event Summary: Name of Physician Notified: Dr. Kym Groom at [phone]    at    Outcome: Transferred (Comment) 2917  Pt was placed on Bipap per Rap. Response resp distress protocol & IV access was obtained.  Dr. Mayford Knife came to the bedside to assess the pt & orders were rcvd for lasix & lopressor as well as tx to SDU.  Pt was stabilized & tx 2917  [name]

## 2013-07-01 NOTE — Progress Notes (Signed)
*  PRELIMINARY RESULTS* Echocardiogram 2D Echocardiogram has been performed.  [name] 07/01/2013, 9:48 AM

## 2013-07-02 DIAGNOSIS — F0390 Unspecified dementia without behavioral disturbance: Secondary | ICD-10-CM

## 2013-07-02 DIAGNOSIS — I214 Non-ST elevation (NSTEMI) myocardial infarction: Secondary | ICD-10-CM

## 2013-07-02 LAB — CBC
HCT: 37.1 % (ref 36.0–46.0)
MCHC: 32.1 g/dL (ref 30.0–36.0)
Platelets: 164 10*3/uL (ref 150–400)
RDW: 13.3 % (ref 11.5–15.5)

## 2013-07-02 LAB — POCT I-STAT 3, ART BLOOD GAS (G3+)
Acid-Base Excess: 7 mmol/L — ABNORMAL HIGH (ref 0.0–2.0)
O2 Saturation: 97 %
pCO2 arterial: 59.9 mmHg (ref 35.0–45.0)

## 2013-07-02 MED ORDER — DIAZEPAM 5 MG PO TABS: 5.0000 mg | ORAL_TABLET | ORAL | Status: DC

## 2013-07-02 MED ORDER — SODIUM CHLORIDE 0.9 % IV SOLN: 1.0000 mL/kg/h | INTRAVENOUS | Status: DC

## 2013-07-02 MED ORDER — SODIUM CHLORIDE 0.9 % IV SOLN: INTRAVENOUS | Status: DC

## 2013-07-02 NOTE — Progress Notes (Signed)
Started to be picking her iv and pulse ox. And trying to get out of bed. Family member at bedside. Ativan given. Continue to monitor.

## 2013-07-02 NOTE — Progress Notes (Signed)
Patient Name: [name]      SUBJECTIVE: events of last pm noted CHF-acute +/- COPD repsonded to BiPAP and diuretics We reviewed history and family ideas re aggresiveness of care.  They are of mixed opinion__son "do whatever is needed" daughter "dont know what how much more to put her through" She is DNR;  At home she is Ox1 only and here has been agitated and confused but today is better Currently denies sob and chest pain Hypotension yesterday prompted holding of afterload  Past Medical History  Diagnosis Date  . Coronary artery disease     multivessel  . Hypotension   . Peripheral artery disease   . Hypertension     hx  . Diabetes mellitus, type 2   . Dyslipidemia   . Tobacco user     remote  . Stroke   . COPD (chronic obstructive pulmonary disease)   . Dementia     Scheduled Meds:  Scheduled Meds: . aspirin EC  81 mg Oral Daily  . citalopram  20 mg Oral Daily  . clopidogrel  75 mg Oral Q breakfast  . enoxaparin (LOVENOX) injection  1 mg/kg Subcutaneous Q12H  . furosemide  20 mg Oral 3 times weekly  . glipiZIDE  10 mg Oral QAC breakfast  . metoprolol tartrate  25 mg Oral BID  . ramipril  5 mg Oral BID  . rosuvastatin  10 mg Oral Daily   Continuous Infusions:   PHYSICAL EXAM Filed Vitals:   07/01/13 1939 07/01/13 2329 07/02/13 0330 07/02/13 0336  BP: 116/46 101/57 110/54   Pulse: 75 73 77   Temp: 98.5 F (36.9 C) 98.4 F (36.9 C)  98.4 F (36.9 C)  TempSrc: Axillary Oral  Oral  Resp: 24 29 28    Height:      Weight:      SpO2: 100% 99% 98%    Well developed and cachectic nasal cannula HENT normal Neck supple with JVP-flat Clear-lateral  Regular rate and rhythm, no murmurs or gallops Abd-soft with active BS No Clubbing cyanosis edema Skin-warm and dry A & Oriented  Grossly normal sensory and motor function  TELEMETRY: NSR w PVC   Intake/Output Summary (Last 24 hours) at 07/02/13 0706 Last data filed at 07/02/13 0400  Gross per 24 hour    Intake    440 ml  Output    575 ml  Net   -135 ml    LABS: Basic Metabolic Panel:  Recent Labs Lab 06/30/13 1045  NA 139  K 3.9  CL 100  CO2 33*  GLUCOSE 192*  BUN 9  CREATININE 0.74  CALCIUM 9.9   Cardiac Enzymes:  Recent Labs  07/01/13 0245 07/01/13 0729 07/01/13 1422  TROPONINI 0.60* 0.56* 0.39*   CBC:  Recent Labs Lab 06/29/13 0745 06/30/13 0515 07/01/13 0240 07/02/13 0450  WBC 7.5 6.4 21.8* 7.1  HGB 10.6* 11.7* 13.6 11.9*  HCT 32.3* 36.0 42.2 37.1  MCV 79.8 79.8 81.5 81.4  PLT 163 176 210 164   PROTIME: No results found for this basename: LABPROT, INR,  in the last 72 hours Liver Function Tests: No results found for this basename: AST, ALT, ALKPHOS, BILITOT, PROT, ALBUMIN,  in the last 72 hours No results found for this basename: LIPASE, AMYLASE,  in the last 72 hours BNP: BNP (last 3 results)  Recent Labs  07/01/13 0245  PROBNP 7432.0*   ASSESSMENT AND PLAN:  Principal Problem:   NSTEMI (non-ST elevated myocardial infarction) Active Problems:  Dementia   Hypotension  The pt situation is a challenge with dementia-progressive informing what to do next.       ABG yday>> severe respir alkalsos>>repeat Tn trending down Lengthy discussion with family   They owuld like to proceed with cath The pt is plesantly emented and will arrange cath but have encouraged family to consider approach as these questions will get tougher and tougher  ABG mych improved today; primarily resp acidosis-chronic  Will follow    Signed, Sherryl Manges MD  07/02/2013

## 2013-07-02 NOTE — Progress Notes (Signed)
Checked to see if pt needed BIPAP. Patients vitals are HR 69...Marland KitchenMarland KitchenO2 98%...and RR 18. Patient is doing well at this time.

## 2013-07-02 NOTE — Progress Notes (Signed)
Patient evaluated for long-term disease management services with [facility] Care Management Program as a benefit of her [employer]. Came to meet with patient. However, she is a bit confused. Will follow up with family at later time. Left contact information and [facility] Care Management brochure at bedside.   Raiford Noble, MSN-Ed, RN,BSN, [facility], [phone]

## 2013-07-02 NOTE — Progress Notes (Signed)
Attempted to get out of bed and pulling the electrodes, daughter made aware and claimed to come tonight so with brother. Bed alarm in used. Closely watched.

## 2013-07-02 NOTE — Progress Notes (Signed)
MD made aware that pt is on diabetic pill and asked if would consider doing cbg but no order given.

## 2013-07-03 ENCOUNTER — Encounter (HOSPITAL_COMMUNITY)
Admission: AD | Disposition: A | Discharge status: Skilled Nursing Facility | Payer: Self-pay | Source: Other Acute Inpatient Hospital | Attending: Internal Medicine

## 2013-07-03 DIAGNOSIS — R0989 Other specified symptoms and signs involving the circulatory and respiratory systems: Secondary | ICD-10-CM

## 2013-07-03 DIAGNOSIS — R451 Restlessness and agitation: Secondary | ICD-10-CM

## 2013-07-03 DIAGNOSIS — IMO0002 Reserved for concepts with insufficient information to code with codable children: Secondary | ICD-10-CM

## 2013-07-03 DIAGNOSIS — R1310 Dysphagia, unspecified: Secondary | ICD-10-CM

## 2013-07-03 DIAGNOSIS — Z515 Encounter for palliative care: Secondary | ICD-10-CM

## 2013-07-03 DIAGNOSIS — R0600 Dyspnea, unspecified: Secondary | ICD-10-CM

## 2013-07-03 LAB — CBC
MCH: 25.9 pg — ABNORMAL LOW (ref 26.0–34.0)
MCV: 81.4 fL (ref 78.0–100.0)
Platelets: 172 10*3/uL (ref 150–400)
RDW: 13 % (ref 11.5–15.5)
WBC: 7.4 10*3/uL (ref 4.0–10.5)

## 2013-07-03 SURGERY — LEFT HEART CATHETERIZATION WITH CORONARY ANGIOGRAM
Anesthesia: LOCAL

## 2013-07-03 MED ORDER — HALOPERIDOL 0.5 MG PO TABS
0.5000 mg | ORAL_TABLET | Freq: Two times a day (BID) | ORAL | Status: DC
  Administered 2013-07-03 – 2013-07-06 (×6): 0.5 mg via ORAL
  Filled 2013-07-03 (×7): qty 1

## 2013-07-03 MED ORDER — MORPHINE SULFATE (CONCENTRATE) 10 MG /0.5 ML PO SOLN: 2.5000 mg | ORAL | Status: DC | PRN

## 2013-07-03 MED ORDER — LORAZEPAM 2 MG/ML IJ SOLN
1.0000 mg | Freq: Once | INTRAMUSCULAR | Status: AC
  Administered 2013-07-03: 1 mg via INTRAVENOUS

## 2013-07-03 MED ORDER — HALOPERIDOL 1 MG PO TABS
1.0000 mg | ORAL_TABLET | Freq: Three times a day (TID) | ORAL | Status: DC | PRN
  Administered 2013-07-03: 1 mg via ORAL
  Filled 2013-07-03 (×2): qty 1

## 2013-07-03 MED ORDER — LORAZEPAM BOLUS VIA INFUSION: 1.0000 mg | Freq: Once | INTRAVENOUS | Status: DC

## 2013-07-03 MED ORDER — ENOXAPARIN SODIUM 40 MG/0.4ML ~~LOC~~ SOLN
40.0000 mg | SUBCUTANEOUS | Status: DC
  Administered 2013-07-04 – 2013-07-06 (×3): 40 mg via SUBCUTANEOUS
  Filled 2013-07-03 (×5): qty 0.4

## 2013-07-03 MED ORDER — LORAZEPAM 2 MG/ML IJ SOLN
INTRAMUSCULAR | Status: AC
  Filled 2013-07-03: qty 1

## 2013-07-03 NOTE — Clinical Social Work Psychosocial (Signed)
     Clinical Social Work Department BRIEF PSYCHOSOCIAL ASSESSMENT 07/03/2013  Patient:  [name], [name]     Account Number:  [id number]     Admit date:  06/29/2013  Clinical Social Worker:  Hulan Fray  Date/Time:  07/03/2013 02:19 PM  Referred by:  Care Management  Date Referred:  07/03/2013 Referred for  SNF Placement   Other Referral:   Interview type:  Family Other interview type:   Melody Comas- Daughter    PSYCHOSOCIAL DATA Living Status:  FAMILY Admitted from facility:   Level of care:   Primary support name:  Marvia Pickles and Dennard Nip Primary support relationship to patient:  CHILD, ADULT Degree of support available:   supportive    CURRENT CONCERNS Current Concerns  Post-Acute Placement   Other Concerns:    SOCIAL WORK ASSESSMENT / PLAN Clinical Social Worker received referral for patient potentially needing SNF. CSW met with patient's daughter and son at bedside. Per family, they are looking into long term placement for patient. CSW provided SNF packet to family.  Son reported that she will not be participating in any therapies at this point. Daughter reported that patient has been previously placed at [facility] because they have a memory care/ locked unit. Daughter reported that there was a medicaid application started, but is unsure of the status. CSW informed family, that for long term placement, they will be looking at private pay, until Medicaid is active. CSW also noted PMT meeting as well. Family is agreeable for CSW to initate SNF search in [place], [place] and [employer].    CSW will complete FL2 for MD's signature and update family when bed offers are made.   Assessment/plan status:  Psychosocial Support/Ongoing Assessment of Needs Other assessment/ plan:   Information/referral to community resources:   SNF packet    PATIENTS/FAMILYS RESPONSE TO PLAN OF CARE: Family is agreeable for CSW to  initate SNF search in [place], [place], and [facility] SNF. Family was appreciative of CSW"s visit and assistance.

## 2013-07-03 NOTE — Progress Notes (Addendum)
Patient Name: [name]      SUBJECTIVE: events of last pm noted w recurrent agitation  Dr Esmond Camper came by and discussed with family his concerns about cath   Denies chest pain but quite agitated   CHF-acute +/- COPD repsonded to BiPAP and diuretics        Past Medical History  Diagnosis Date  . Coronary artery disease     multivessel  . Hypotension   . Peripheral artery disease   . Hypertension     hx  . Diabetes mellitus, type 2   . Dyslipidemia   . Tobacco user     remote  . Stroke   . COPD (chronic obstructive pulmonary disease)   . Dementia     Scheduled Meds:  Scheduled Meds: . aspirin EC  81 mg Oral Daily  . citalopram  20 mg Oral Daily  . clopidogrel  75 mg Oral Q breakfast  . diazepam  5 mg Oral On Call  . enoxaparin (LOVENOX) injection  1 mg/kg Subcutaneous Q12H  . furosemide  20 mg Oral 3 times weekly  . glipiZIDE  10 mg Oral QAC breakfast  . LORazepam      . metoprolol tartrate  25 mg Oral BID  . ramipril  5 mg Oral BID  . rosuvastatin  10 mg Oral Daily   Continuous Infusions: . sodium chloride 1 mL/kg/hr (07/03/13 0413)  . sodium chloride      PHYSICAL EXAM Filed Vitals:   07/03/13 0600 07/03/13 0700 07/03/13 0737 07/03/13 0758  BP:   124/44   Pulse:      Temp:    97.9 F (36.6 C)  TempSrc:    Axillary  Resp: 30 30 26    Height:      Weight:      SpO2:   98% 99%   Well developed and cachectic nasal cannula HENT normal  Neck supple w  Clear-lateral  Regular rate and rhythm, no murmurs or gallops Abd-soft with active BS No Clubbing cyanosis edema Skin-warm and dry A & Oriented  Grossly normal sensory and motor function Agitated  TELEMETRY: NSR w PVC   Intake/Output Summary (Last 24 hours) at 07/03/13 0824 Last data filed at 07/03/13 0700  Gross per 24 hour  Intake    460 ml  Output    800 ml  Net   -340 ml    LABS: Basic Metabolic Panel:  Recent Labs Lab 06/30/13 1045  NA 139  K 3.9  CL 100  CO2 33*  GLUCOSE  192*  BUN 9  CREATININE 0.74  CALCIUM 9.9   Cardiac Enzymes:  Recent Labs  07/01/13 0245 07/01/13 0729 07/01/13 1422  TROPONINI 0.60* 0.56* 0.39*   CBC:  Recent Labs Lab 06/29/13 0745 06/30/13 0515 07/01/13 0240 07/02/13 0450 07/03/13 0430  WBC 7.5 6.4 21.8* 7.1 7.4  HGB 10.6* 11.7* 13.6 11.9* 11.7*  HCT 32.3* 36.0 42.2 37.1 36.8  MCV 79.8 79.8 81.5 81.4 81.4  PLT 163 176 210 164 172   PROTIME: No results found for this basename: LABPROT, INR,  in the last 72 hours Liver Function Tests: No results found for this basename: AST, ALT, ALKPHOS, BILITOT, PROT, ALBUMIN,  in the last 72 hours No results found for this basename: LIPASE, AMYLASE,  in the last 72 hours BNP: BNP (last 3 results)  Recent Labs  07/01/13 0245  PROBNP 7432.0*   ASSESSMENT AND PLAN:  Principal Problem:   NSTEMI (non-ST elevated myocardial infarction) Active Problems:  Dementia   Hypotension  The pt situation is a challenge with dementia-progressive informing what to do next.  The family and I  discussed goals of care and role of cath and end of life issues and home health care challenges  Pt not able to participate  Will cancel cath and pursue medical therapy      palliative care and home health mgmt consult Home/NH asap Will  D/c iv and tele Haldol at night   Signed, Sherryl Manges MD  07/03/2013

## 2013-07-03 NOTE — Clinical Social Work Placement (Addendum)
    Clinical Social Work Department CLINICAL SOCIAL WORK PLACEMENT NOTE 07/03/2013  Patient:  [name], [name]  Account Number:  [id number] Admit date:  06/29/2013  Clinical Social Worker:  Hulan Fray  Date/time:  07/03/2013 02:54 PM  Clinical Social Work is seeking post-discharge placement for this patient at the following level of care:   SKILLED NURSING   (*CSW will update this form in Epic as items are completed)   07/03/2013  Patient/family provided with [facility] Department of Clinical Social Work's list of facilities offering this level of care within the geographic area requested by the patient (or if unable, by the patient's family).  07/03/2013  Patient/family informed of their freedom to choose among providers that offer the needed level of care, that participate in Medicare, Medicaid or managed care program needed by the patient, have an available bed and are willing to accept the patient.  07/03/2013  Patient/family informed of [facility]' ownership interest in [facility], as well as of the fact that they are under no obligation to receive care at this facility.  PASARR submitted to EDS on 07/26/2012 PASARR number received from EDS on 07/26/2012  FL2 transmitted to all facilities in geographic area requested by pt/family on  07/03/2013 FL2 transmitted to all facilities within larger geographic area on   Patient informed that his/her managed care company has contracts with or will negotiate with  certain facilities, including the following:     Patient/family informed of bed offers received:07-05-13   Patient chooses bed at [facility] Physician recommends and patient chooses bed at    Patient to be transferred to [facility] on    Patient to be transferred to facility by   The following physician request were entered in Epic:   Additional Comments:

## 2013-07-03 NOTE — Progress Notes (Signed)
Patient WU:[id number] [name]      DOB: 1933/10/29      YNW:[id number]  Patient is a 79 yo WF with PMH of HTN, HL, DM2, carotid stenosis, progressive dementia and CAD. Transferred from [facility] on 06/28/13 for further evaluation of NSTEMI. Patient was not candidate for cardiac cath due to agitation related to dementia.   Met with patients daughter, Laurena Bering [facility]) and patients son, Jayliah Benett. Per daughter prior to this admission patient was living in her own home but required contanst caregiver present. Daughter would stay from 8p-8a and patients elderly sister would stay from 8a-8p. Daughter states she would have persistent daily episodes of agitation generally in evening, was prescribed Ativan 0.5 mg but it was not effective. She was able to walk without assist, incontinent at times with urine, appetite was good, occasional episodes of coughing while eating  We talked about patient's current medical issues, cardiac insult and progressive dementia. Discussed progressiveness of both disease states. Family would still want to seek medical interventions for any acute medical issues at this point in time. We completed a Medical Orders for Scope of Practice form, which was placed on chart.   Symptom Management:  Agitation: Ordered Haldol 0.5 mg twice daily scheduled with as needed dosing Haldol 1 mg oral every 8 hours as needed  Dysphagia: ordered SLP evaluation and recommendations for modified diet as needed  Dyspnea/Pain: Ordered Roxanol 2.5 mg SL every 4 hours as needed  Discussed above orders with Cardiology service: Ward Givens NP on call  Disposition: Rozetta Nunnery SW assisting family with disposition plans-daughter at this point is hopeful for SNF placement. Not sure if this will be feasible based on discussion with SW due to insurance coverage. Daughter encouraged to f/u on [employer] application placed several months ago per daughter.    Full Palliative Note to follow.   Time  Spent: 1:30p-3:30p  Freddie Breech, CNS-C Palliative Medicine Team [facility] Health Team Phone: [phone] Pager: [phone]

## 2013-07-03 NOTE — Progress Notes (Signed)
Thank you for consulting the Palliative Medicine Team at [facility] to meet your patient's and family's needs.   The reason that you asked [place] to see your patient is for clarification of goals, options  We have scheduled your patient for a meeting: today, Wednesday 07/03/13 @ 1:30 pm  The Surrogate decision maker is: daughter/POA Laurena Bering c: [phone]  Other family members that need to be present: per daughter she and her brother Lucresia Simic c: [phone] will be present for meeting  Your patient is able/unable to participate: unable -h/o Dementia   Valente David, RN 07/03/2013, 11:07 AM Palliative Medicine Team RN Liaison [phone]

## 2013-07-03 NOTE — Progress Notes (Signed)
Transferred to 6019 by wheelchair , stable. Report given to RN.

## 2013-07-04 DIAGNOSIS — F0391 Unspecified dementia with behavioral disturbance: Secondary | ICD-10-CM

## 2013-07-04 LAB — CBC
MCH: 26.2 pg (ref 26.0–34.0)
MCHC: 32.3 g/dL (ref 30.0–36.0)
MCV: 81 fL (ref 78.0–100.0)
Platelets: 191 10*3/uL (ref 150–400)
RDW: 13.1 % (ref 11.5–15.5)

## 2013-07-04 MED ORDER — BISACODYL 10 MG RE SUPP: 10.0000 mg | Freq: Every day | RECTAL | Status: DC | PRN

## 2013-07-04 MED ORDER — SENNA 8.6 MG PO TABS
1.0000 | ORAL_TABLET | Freq: Every day | ORAL | Status: DC
  Administered 2013-07-04 – 2013-07-05 (×2): 8.6 mg via ORAL
  Filled 2013-07-04 (×3): qty 1

## 2013-07-04 NOTE — Progress Notes (Signed)
Chaplain Note: Responded to request from Palliative Care and found pt lying in bed visiting with her niece. Pt's sitter also in room. Pt talked briefly then fell asleep. Chaplain engaged in active and reflective listening as pt's niece talked about pt's current health challenge and what she has been through in the past as well as about pt's twin sister (niece's mother) who died a few years ago.  Will follow up as needed.  Rutherford Nail Chaplain [phone]

## 2013-07-04 NOTE — Progress Notes (Signed)
Came to visit at bedside earlier today after 1 pm to speak with daughter Chyrl Civatte. However, patient's niece report that Chyrl Civatte will be in later. Wanted to follow up regarding [facility] Care Management services. However, it appears disposition likely SNF. Therefore, [facility] Care Management will not be appropriate. Will continue to follow and contact daughter to confirm.   Raiford Noble, MSN- Ed, RN,BSN- [facility] [phone]

## 2013-07-04 NOTE — Clinical Documentation Improvement (Signed)
THIS DOCUMENT IS NOT A PERMANENT PART OF THE MEDICAL RECORD  Please update your documentation within the medical record to reflect your response to this query. If you need assistance,  please call [phone]  07/04/13  Dear Provider,  In a better effort to capture your patient's severity of illness, reflect appropriate length of stay and utilization of resources, a review of the patient medical record has revealed the following indicators for the diagnosis of Heart Failure.  Based on your clinical judgment, please clarify and document in a progress note and/or discharge summary the clinical condition associated with the following supporting information: In responding to this query please exercise your independent judgment. The fact that a query is asked, does not imply that any particular answer is desired/expected.  Possible Clinical Conditions?  Acute Systolic Congestive Heart Failure Acute Diastolic Congestive Heart Failure Acute Systolic & Diastolic Congestive Heart Failure Other Condition Cannot Clinically Determine  Supporting Information:  Risk Factors: AMI  Signs & Symptoms: per progress note 7/07: CTSP secondary to severe agitation, SOB and hypertension. The patient was doing well and got up to go to the bathroom and went back to sit down and complained of severe SOB. BP was noted at 194/158mmHg with heart rate 130bpm in sinus tachycardia. Dr. Mayford Knife came to the bedside to assess the pt & orders were rcvd for lasix & lopressor as well as tx to SDU. On assessment pt with crackles throughout, diaphoretic, lethargic, Sats 85% VM.   Diagnostics: ECHO: Doppler parameters are consistent with abnormal left ventricular relaxation (grade 1 diastolic dysfunction).The estimated ejection fraction was in the range of 45% to 50%. Systolic function was mildly reduced.  Treatment: lasix 40 mgs IV  Reviewed: additional documentation in the medical record addendum added DJ[facility]  Thank  You,  Amada Kingfisher  RN, BSN, CCM Clinical Documentation Specialist: [phone] Health Information Management [place] [email or web address]

## 2013-07-04 NOTE — Progress Notes (Signed)
Patient [name]      DOB: July 24, 1933      VWU:[id number]   Palliative Medicine Team at [facility] Progress Note    Subjective: Mrs. Stegemann was able to identify herself but not her daughter who was at bedside. She states "that is my twin sister".  Daughter Aurea Graff reports she is definitely less agitated compared with yesterday but still needs supervision. Her daughter has obtained her mother's medicaid number .  She is still enrolled.  Daughter looking forward to speaking with the social worker regarding placement in the [place] area.  Filed Vitals:   07/04/13 1348  BP: 110/56  Pulse: 71  Temp: 97.6 F (36.4 C)  Resp: 18   Physical exam: General: not oriented to current visitors, but oriented to her own name. Sitting on the edge of the bed smiling Chest: decreased but clear CVS: R,R, R, S1, S2 Abd: soft not tender, positive bowel sounds Ext: warm, no edema Neuro: oriented to self but not her daughter or time  Lab Results  Component Value Date   WBC 6.5 07/04/2013   HGB 11.7* 07/04/2013   HCT 36.2 07/04/2013   MCV 81.0 07/04/2013   PLT 191 07/04/2013      Assessment and plan: 79 yr old white female s/p NSTEMI complicated by advancing dementia.  Haldol low dose added bid with prn dosing.  Patient did well with this.Will continue to support and assist with transition of care.  1.  DNR  2.  Dementia with behavior:  Continue low dose haldol.  Total time 15 min.  Lavontay Kirk L. Ladona Ridgel, MD MBA The Palliative Medicine Team at [facility] Phone: [phone] Pager: [phone]

## 2013-07-04 NOTE — Evaluation (Signed)
Clinical/Bedside Swallow Evaluation Patient Details  Name: [name] MRN: [id number] Date of Birth: [birth year]  Today's Date: [name] Time: 0905-0924 SLP Time Calculation (min): 19 min  Past Medical History:  Past Medical History  Diagnosis Date  . Coronary artery disease     multivessel  . Hypotension   . Peripheral artery disease   . Hypertension     hx  . Diabetes mellitus, type 2   . Dyslipidemia   . Tobacco user     remote  . Stroke   . COPD (chronic obstructive pulmonary disease)   . Dementia    Past Surgical History: History reviewed. No pertinent past surgical history. HPI:  Patient is a 79 yo WF with PMH of HTN, HL, DM2, carotid stenosis, progressive dementia and CAD. Transferred from [facility] on 06/28/13 for further evaluation of NSTEMI. Patient was not candidate for cardiac cath due to agitation related to dementia.  Per daughter prior to this admission patient was living in her own home but required contanst caregiver present. Daughter would stay from 8p-8a and patients elderly sister would stay from 8a-8p. Daughter states she would have persistent daily episodes of agitation generally in evening, was prescribed Ativan 0.5 mg but it was not effective. She was able to walk without assist, incontinent at times with urine, appetite was good, occasional episodes of coughing while eating   Assessment / Plan / Recommendation Clinical Impression  Pt sitting edge of bed, feeding herself regular texture meal independently. No evidence of aspiration or oral dysphagia. Sister present, reports infrequent cough with PO, reports pt eats very well. Offered sister and pt basic aspiration precautions and discussed progression of dementia and impact on PO intake, signs of aspiration etc. Recommend pt continue regular diet and thin liquids as she tolerates well and POs are clearly a source of enjoyment for her. SLP will sign off.     Aspiration Risk  Mild    Diet  Recommendation Regular;Thin liquid   Liquid Administration via: Cup;Straw Medication Administration: Whole meds with liquid Supervision: Patient able to self feed;Full supervision/cueing for compensatory strategies Postural Changes and/or Swallow Maneuvers: Out of bed for meals    Other  Recommendations Oral Care Recommendations: Oral care BID   Follow Up Recommendations  None    Frequency and Duration        Pertinent Vitals/Pain NA    SLP Swallow Goals     Swallow Study Prior Functional Status       General HPI: Patient is a 79 yo WF with PMH of HTN, HL, DM2, carotid stenosis, progressive dementia and CAD. Transferred from [facility] on 06/28/13 for further evaluation of NSTEMI. Patient was not candidate for cardiac cath due to agitation related to dementia.  Per daughter prior to this admission patient was living in her own home but required contanst caregiver present. Daughter would stay from 8p-8a and patients elderly sister would stay from 8a-8p. Daughter states she would have persistent daily episodes of agitation generally in evening, was prescribed Ativan 0.5 mg but it was not effective. She was able to walk without assist, incontinent at times with urine, appetite was good, occasional episodes of coughing while eating Type of Study: Bedside swallow evaluation Previous Swallow Assessment: none Diet Prior to this Study: Regular;Thin liquids Temperature Spikes Noted: No Respiratory Status: Room air History of Recent Intubation: No Behavior/Cognition: Alert;Cooperative;Pleasant mood Oral Cavity - Dentition: Adequate natural dentition Self-Feeding Abilities: Able to feed self Patient Positioning: Other (comment) (edge of bed) Baseline  Vocal Quality: Clear Volitional Cough: Strong Volitional Swallow: Able to elicit    Oral/Motor/Sensory Function Overall Oral Motor/Sensory Function: Appears within functional limits for tasks assessed   Ice Chips     Thin Liquid Thin  Liquid: Within functional limits    Nectar Thick     Honey Thick     Puree Puree: Within functional limits   Solid   GO    Solid: Within functional limits      [facility], MA CCC-SLP [phone]  [name],[name]

## 2013-07-04 NOTE — Consult Note (Signed)
Patient [name]      DOB: October 01, 1933      NFA:[id number]     Consult Note from the Palliative Medicine Team at [facility]    Consult Requested by: Sherryl Manges MD     PCP: Donzetta Sprung, MD Reason for Consultation:Goals of Care     Phone Number:[phone]  Assessment of patients Current state: Patient moderately restless, was initially sitting up in bed, began scooting to bottom of bed and walking toward door of room, gait unsteady,not combative but pleasantly confused, difficult to re-direct attention. Will require safety sitter since patient would be fall risk.   Reviewed chart, spoke with staff and social worker involved with patient.  Met with patients daughter, Laurena Bering [facility]) and patients son, Ahlana Slaydon. Per daughter prior to this admission patient was living in her own home but required contanst caregiver present. Daughter would stay from 8p-8a and patients elderly sister would stay from 8a-8p. Daughter states she would have persistent daily episodes of agitation generally in evening, was prescribed Ativan 0.5 mg but it was not effective. She was able to walk without assist, incontinent at times with urine, appetite was good, occasional episodes of coughing while eating   We talked about patient's current medical issues, cardiac insult and progressive dementia. Discussed progressiveness of both disease states. Family would still want to seek medical interventions for any acute medical issues at this point in time. We completed a Medical Orders for Scope of Practice form, which was placed on chart.   I also discussed the concepts of palliative care and hospice care with family. Based on families decisions regarding goals of care for patient and current level of cardiac disease and dementia, patient would most likely not a candidate for hospice support. Informed family as patient declines they are able to contact local hospice agencies in their community to have patient  evaluated for home hospice or residential hospice placement, as patient declines.   Goals of Care: 1.  Code Status:DNR/DNI   Continue current conventional level of medical management   2. Scope of Treatment: 1. Vital Signs:routine 2. AICD: No  3. Respiratory/Oxygen:support as indicated for symptom management-would want BIPAP used as indicated 4. Nutritional Support/Tube Feeds: 5. Antibiotics:as indicated for identified infections 6. Blood Products: yes 7. IVF: yes for defined period of time 8. Review of Medications to be discontinued:Yes 9. Labs: as indicated 10. Telemetry:Yes 11. Consults:spiritual   4. Disposition: Rozetta Nunnery SW assisting family with disposition plans-daughter at this point is hopeful for SNF placement. Also informed family of [place] Support services through [facility] available if patient discharges to SNF. Not sure if this will be feasible based on discussion with SW due to insurance coverage. Daughter encouraged to f/u on [employer] application placed several months ago per daughter.   3. Symptom Management:  Symptom Management:   Agitation: Ordered Haldol 0.5 mg twice daily scheduled with as needed dosing Haldol 1 mg oral every 8 hours as needed (EKG 07/01/13: QTc Interval: 466). Family aware of potential side effects on QTc interval with high dose or IV Haldol, agreeable with use of Haldol.  Dysphagia: ordered SLP evaluation and recommendations for modified diet as needed   Dyspnea/Pain: Ordered Roxanol 2.5 mg SL every 4 hours as needed   Bowel Regime: Senokot 1 tab HS, Dulcolax suppository as needed daily  Discussed above orders as well as current QTc interval/use of low dose Haldol with Cardiology service: Ward Givens NP on call   4. Psychosocial:  Per daughter prior to this admission patient was living in her own home but required contanst caregiver present. Daughter would stay from 8p-8a and patients elderly sister would stay from  8a-8p. Daughter states she would have persistent daily episodes of agitation generally in evening, was prescribed Ativan 0.5 mg but it was not effective. This regime very exhausting for daughter and elderly aunt.  5. Spiritual:consult placed per family request.    Patient Documents Completed or Given: Document Given Completed  Advanced Directives Pkt    MOST  Yes  DNR  Yes  Gone from My Sight    Hard Choices Yes     Brief HPI: Patient is a 79 yo WF with PMH of HTN, HL, DM2, carotid stenosis, progressive dementia and CAD. Transferred from [facility] on 06/28/13 for further evaluation of NSTEMI. Patient was not candidate for cardiac cath due to agitation related to dementia.   ROS: unable to elicit full ROS due to patients level of dementia-does admit to joint pain and SOB at times    PMH:  Past Medical History  Diagnosis Date  . Coronary artery disease     multivessel  . Hypotension   . Peripheral artery disease   . Hypertension     hx  . Diabetes mellitus, type 2   . Dyslipidemia   . Tobacco user     remote  . Stroke   . COPD (chronic obstructive pulmonary disease)   . Dementia      ZOX:WRUEAVW reviewed. No pertinent past surgical history. I have reviewed the FH and SH and  If appropriate update it with new information. Allergies  Allergen Reactions  . Atorvastatin     REACTION: myalgia  . Penicillins     REACTION: rash   Scheduled Meds: . aspirin EC  81 mg Oral Daily  . citalopram  20 mg Oral Daily  . clopidogrel  75 mg Oral Q breakfast  . enoxaparin (LOVENOX) injection  40 mg Subcutaneous Q24H  . furosemide  20 mg Oral 3 times weekly  . glipiZIDE  10 mg Oral QAC breakfast  . haloperidol  0.5 mg Oral BID  . metoprolol tartrate  25 mg Oral BID  . ramipril  5 mg Oral BID  . rosuvastatin  10 mg Oral Daily   Continuous Infusions:  PRN Meds:.acetaminophen, haloperidol, morphine CONCENTRATE, nitroGLYCERIN, ondansetron (ZOFRAN) IV    BP 90/38  Pulse  70  Temp(Src) 97.9 F (36.6 C) (Oral)  Resp 18  Ht 5\' 6"  (1.676 m)  Wt 59.104 kg (130 lb 4.8 oz)  BMI 21.04 kg/m2  SpO2 93%   PPS:30-40%   Intake/Output Summary (Last 24 hours) at 07/04/13 0917 Last data filed at 07/03/13 1833  Gross per 24 hour  Intake    360 ml  Output      0 ml  Net    360 ml   LBM:  No BM documented since admission 06/29/13                  Physical Exam:  General: Alert, confused, moderately agitated/restless HEENT:  Buccal mucosa moist Chest: CTA, diminished at bases CVS: RRR Abdomen:soft, non-tender, BS audible Ext: no pedal edema Neuro:disoriented-mildly agitated  Variable  0 Points 1 Point 2 Points Total  Heart rate per minute  <90 beats 90-109 beats >110 beats 0  Respiratory  Rate per minute < 18 breaths 19-30 breaths  >30 breaths 1  Restlessness; nonpurposeful movements None  occas slight movement Frequent movement 1  Paradoxical breathing pattern: None  Present 0  Accessory muscle use: rise in clavicle during inspiration None Slight rise Pronounced rise 1  Grunting at end-expiration: guttural sound None  Present 0  Nasal flaring: involuntary movement of nares None  Present 0  Look of fear None  Eyes wide 0  Overall total out of 16    3/16    Respiratory Distress Observation Scale Journal of Palliative Medicine Vol. 13, Number 3, 2010 Campbell et al. Page. 285-290  Labs: CBC    Component Value Date/Time   WBC 6.5 07/04/2013 0500   RBC 4.47 07/04/2013 0500   HGB 11.7* 07/04/2013 0500   HCT 36.2 07/04/2013 0500   PLT 191 07/04/2013 0500   MCV 81.0 07/04/2013 0500   MCH 26.2 07/04/2013 0500   MCHC 32.3 07/04/2013 0500   RDW 13.1 07/04/2013 0500   LYMPHSABS 2.0 12/22/2008 0415   MONOABS 1.0 12/22/2008 0415   EOSABS 0.0 12/22/2008 0415   BASOSABS 0.0 12/22/2008 0415    BMET    Component Value Date/Time   NA 139 06/30/2013 1045   K 3.9 06/30/2013 1045   CL 100 06/30/2013 1045   CO2 33* 06/30/2013 1045   GLUCOSE 192* 06/30/2013 1045   BUN 9  06/30/2013 1045   CREATININE 0.74 06/30/2013 1045   CALCIUM 9.9 06/30/2013 1045   GFRNONAA 79* 06/30/2013 1045   GFRAA >90 06/30/2013 1045    CMP     Component Value Date/Time   NA 139 06/30/2013 1045   K 3.9 06/30/2013 1045   CL 100 06/30/2013 1045   CO2 33* 06/30/2013 1045   GLUCOSE 192* 06/30/2013 1045   BUN 9 06/30/2013 1045   CREATININE 0.74 06/30/2013 1045   CALCIUM 9.9 06/30/2013 1045   PROT 5.7* 12/20/2008 0413   ALBUMIN 3.2* 12/20/2008 0413   AST 51* 12/20/2008 0413   ALT 23 12/20/2008 0413   ALKPHOS 52 12/20/2008 0413   BILITOT 1.2 12/20/2008 0413   GFRNONAA 79* 06/30/2013 1045   GFRAA >90 06/30/2013 1045    Chest Xray Reviewed/Impressions:07/01/13 IMPRESSION:  Emphysematous changes in the lungs. No evidence of active  pulmonary disease.  Echo/2D Reviewed/Impressions:07/01/13  Study Conclusions  - Left ventricle: Wall thickness was increased in a pattern of moderate LVH. There was mild focal basal hypertrophy of the septum. Systolic function was mildly reduced. The estimated ejection fraction was in the range of 45% to 50%. There is hypokinesis of the mid-distalanteroseptal myocardium. Doppler parameters are consistent with abnormal left ventricular relaxation (grade 1 diastolic dysfunction). - Mitral valve: Calcified annulus.   Time In Time Out Total Time Spent with Patient Total Overall Time  1:30p 3:30p 120 min 120 min    Greater than 50%  of this time was spent counseling and coordinating care related to the above assessment and plan.  Freddie Breech, CNS-C Palliative Medicine Team [facility] Health Team Phone: [phone] Pager: [phone]

## 2013-07-04 NOTE — Clinical Documentation Improvement (Signed)
THIS DOCUMENT IS NOT A PERMANENT PART OF THE MEDICAL RECORD  Please update your documentation within the medical record to reflect your response to this query. If you need assistance,  please call [phone].  07/04/13  Dear Dr.Providers,  In a better effort to capture your patient's severity of illness, reflect appropriate length of stay and utilization of resources, a review of the patient medical record has revealed the following indicators. Based on your clinical judgment, please clarify and document in a progress note and/or discharge summary the clinical condition associated with the following supporting information: In responding to this query please exercise your independent judgment.  The fact that a query is asked, does not imply that any particular answer is desired or expected.  Possible Clinical Conditions?  Acute Respiratory Failure Acute on Chronic Respiratory Failure Other Condition Cannot Clinically Determine   Supporting Information:  Risk Factors:AMI, CHF, Dementia,   Signs&Symptoms: per progress note 7/8: CHF-acute +/- COPD responded to Bipap and diuretics; RR: 34; diminished breath sounds, crackles,   Diagnostics: ABG's     PO2 97.6      PCO2 76.9     PH 7.210     BiCarb 29.6     Date of ABG's 07/01/2013     O2 concentration/mode: 50% FM  Radiology: increased vascular markings c/w CHF  Treatment: O2 concentrations/O2 Mode: (2-4 l/m [place], 40-50% Ventimask,  50% BiPAP  You may use possible, probable, or suspect with inpatient documentation. possible, probable, suspected diagnoses MUST be documented at the time of discharge  Reviewed: additional documentation in the medical record addendum added [facility] coder notified to rebill  Thank You,  Amada Kingfisher RN, BSN, CCM Clinical Documentation Specialist: Health Information Management  [phone] [email or web address]

## 2013-07-04 NOTE — Progress Notes (Signed)
Patient Name: [name]      SUBJECTIVE: palliative care and CSW consult appreciated  Alert this am , apparently slept well      CHF-acute +/- COPD repsonded to BiPAP and diuretics        Past Medical History  Diagnosis Date  . Coronary artery disease     multivessel  . Hypotension   . Peripheral artery disease   . Hypertension     hx  . Diabetes mellitus, type 2   . Dyslipidemia   . Tobacco user     remote  . Stroke   . COPD (chronic obstructive pulmonary disease)   . Dementia     Scheduled Meds:  Scheduled Meds: . aspirin EC  81 mg Oral Daily  . citalopram  20 mg Oral Daily  . clopidogrel  75 mg Oral Q breakfast  . enoxaparin (LOVENOX) injection  40 mg Subcutaneous Q24H  . furosemide  20 mg Oral 3 times weekly  . glipiZIDE  10 mg Oral QAC breakfast  . haloperidol  0.5 mg Oral BID  . metoprolol tartrate  25 mg Oral BID  . ramipril  5 mg Oral BID  . rosuvastatin  10 mg Oral Daily   Continuous Infusions:    PHYSICAL EXAM Filed Vitals:   07/03/13 1230 07/03/13 1403 07/03/13 2202 07/04/13 0509  BP:  158/74 122/65 90/38  Pulse:  77 87 70  Temp: 97.6 F (36.4 C) 97.9 F (36.6 C) 97.6 F (36.4 C) 97.9 F (36.6 C)  TempSrc: Oral Oral Oral Oral  Resp:  18 18 18   Height:  5\' 6"  (1.676 m)    Weight:  130 lb 4.8 oz (59.104 kg)    SpO2: 98% 97% 92% 93%   Well developed and cachectic nasal cannula HENT normal  Neck supple   Clear   Regular rate and rhythm, no murmurs or gallops Abd-soft with active BS No Clubbing cyanosis edema Skin-warm and dry Alert Grossly normal sensory and motor function   TELEMETRY: NSR w PVC   Intake/Output Summary (Last 24 hours) at 07/04/13 0832 Last data filed at 07/03/13 1833  Gross per 24 hour  Intake    360 ml  Output      0 ml  Net    360 ml    LABS: Basic Metabolic Panel:  Recent Labs Lab 06/30/13 1045  NA 139  K 3.9  CL 100  CO2 33*  GLUCOSE 192*  BUN 9  CREATININE 0.74  CALCIUM 9.9    Cardiac Enzymes:  Recent Labs  07/01/13 1422  TROPONINI 0.39*   CBC:  Recent Labs Lab 06/29/13 0745 06/30/13 0515 07/01/13 0240 07/02/13 0450 07/03/13 0430 07/04/13 0500  WBC 7.5 6.4 21.8* 7.1 7.4 6.5  HGB 10.6* 11.7* 13.6 11.9* 11.7* 11.7*  HCT 32.3* 36.0 42.2 37.1 36.8 36.2  MCV 79.8 79.8 81.5 81.4 81.4 81.0  PLT 163 176 210 164 172 191   PROTIME: No results found for this basename: LABPROT, INR,  in the last 72 hours Liver Function Tests: No results found for this basename: AST, ALT, ALKPHOS, BILITOT, PROT, ALBUMIN,  in the last 72 hours No results found for this basename: LIPASE, AMYLASE,  in the last 72 hours BNP: BNP (last 3 results)  Recent Labs  07/01/13 0245  PROBNP 7432.0*   ASSESSMENT AND PLAN:  Principal Problem:   NSTEMI (non-ST elevated myocardial infarction) Active Problems:   Dementia   Hypotension   Dyspnea   Agitation  Dysphagia, unspecified(787.20)   Palliative care encounter  The pt situation is a challenge with dementia-progressive informing what to do next.  The family and I  discussed goals of care and role of cath and end of life issues and home health care challenges  Pt not able to participate  Med Rx fo r NSTEMI        palliative care and home health mgmt consult appreciated  Looking for SNP   Haldol at night really helped    Signed, Sherryl Manges MD  07/04/2013

## 2013-07-05 LAB — CBC
HCT: 35 % — ABNORMAL LOW (ref 36.0–46.0)
MCHC: 32.9 g/dL (ref 30.0–36.0)
Platelets: 171 10*3/uL (ref 150–400)
RDW: 13 % (ref 11.5–15.5)

## 2013-07-05 NOTE — Clinical Social Work Note (Addendum)
Clinical Social Worker called and spoke with daughter and provided list of SNF bed offers received. Patient has two SNF's that are available, [facility]. Daughter also reported that the she received medicaid number for patient, [id number]. Daughter is speaking with her brother to decide on which SNF to confirm. CSW will await return call.   12:00pm CSW received voice message from daughter confirming [facility] SNF. CSW updated facility with Medicaid number.   15:18pm CSW received call from facility and they are able to receive patient today. CSW notified MD. CSW will await discharge summary to faciliate discharge today. [facility] Berkley Harvey- [phone]  16:23pm CSW spoke with facility and they will be able to receive patient tomorrow, given it's late in the day. CSW will leave a handoff for weekend to facilitate discharge to facility. CSW will notify MD.  Rozetta Nunnery MSW, [facility] [phone]

## 2013-07-05 NOTE — Progress Notes (Addendum)
Removed L Forearm IV dated 07/01/13. IV occluded when trying to flush. Catheter intact upon removal. Site bleeding for just a moment, otherwise CDI.

## 2013-07-05 NOTE — Progress Notes (Signed)
   SUBJECTIVE:  No chest pain or SOB.   PHYSICAL EXAM Filed Vitals:   07/04/13 0509 07/04/13 1348 07/04/13 2114 07/05/13 0510  BP: 90/38 110/56 145/61 95/58  Pulse: 70 71 78 68  Temp: 97.9 F (36.6 C) 97.6 F (36.4 C) 97.8 F (36.6 C) 97.8 F (36.6 C)  TempSrc: Oral Oral Oral   Resp: 18 18 17 18   Height:      Weight:      SpO2: 93% 92% 97% 100%   General:  No distress Lungs:  Clear Heart:  RRR Abdomen:  Positive bowel sounds, no rebound no guarding Extremities:  No edema  LABS:  Results for orders placed during the hospital encounter of 06/29/13 (from the past 24 hour(s))  CBC     Status: Abnormal   Collection Time    07/05/13  5:15 AM      Result Value Range   WBC 6.2  4.0 - 10.5 K/uL   RBC 4.35  3.87 - 5.11 MIL/uL   Hemoglobin 11.5 (*) 12.0 - 15.0 g/dL   HCT 40.9 (*) 81.1 - 91.4 %   MCV 80.5  78.0 - 100.0 fL   MCH 26.4  26.0 - 34.0 pg   MCHC 32.9  30.0 - 36.0 g/dL   RDW 78.2  95.6 - 21.3 %   Platelets 171  150 - 400 K/uL    Intake/Output Summary (Last 24 hours) at 07/05/13 0837 Last data filed at 07/04/13 1330  Gross per 24 hour  Intake    480 ml  Output      0 ml  Net    480 ml    ASSESSMENT AND PLAN:  NSTEMI (non-ST elevated myocardial infarction):  Medical management.  Continue current meds.    Dementia:  No distress or agitation this am.   Palliative care encounter:  We appreciate the follow and consultation.  We are awaiting NHP.  I spoke with the nurse today and they are having a progression meeting.  They know that the patient is cleared to go from a medical standpoint.      Rollene Rotunda 07/05/2013 8:37 AM

## 2013-07-06 DIAGNOSIS — F0390 Unspecified dementia without behavioral disturbance: Secondary | ICD-10-CM

## 2013-07-06 LAB — CBC
HCT: 36.2 % (ref 36.0–46.0)
MCH: 26.2 pg (ref 26.0–34.0)
Platelets: 188 10*3/uL (ref 150–400)
RBC: 4.46 MIL/uL (ref 3.87–5.11)
WBC: 6.5 10*3/uL (ref 4.0–10.5)

## 2013-07-06 MED ORDER — ACETAMINOPHEN 325 MG PO TABS: 650.0000 mg | ORAL_TABLET | ORAL | Status: AC | PRN

## 2013-07-06 MED ORDER — ROSUVASTATIN CALCIUM 10 MG PO TABS: 10.0000 mg | ORAL_TABLET | Freq: Every day | ORAL | Status: DC

## 2013-07-06 MED ORDER — METOPROLOL TARTRATE 25 MG PO TABS: 25.0000 mg | ORAL_TABLET | Freq: Two times a day (BID) | ORAL | Status: DC

## 2013-07-06 MED ORDER — BISACODYL 10 MG RE SUPP: 10.0000 mg | RECTAL | Status: AC | PRN

## 2013-07-06 NOTE — Progress Notes (Signed)
Clinical Child psychotherapist (CSW) informed that pt is ready for discharge today. CSW prepared dc packet and placed in pt shadow chart. CSW confirmed with [place] at the facility that pt is able to be admitted today. CSW provided facility contact number to RN, informed pt daughter and contacted [facility] for transport. No additional needs, CSW signing off.  Theresia Bough, MSW, [facility] [phone]

## 2013-07-06 NOTE — Progress Notes (Signed)
SUBJECTIVE:No complaints. Bed available at [facility] today.  Principal Problem:   NSTEMI (non-ST elevated myocardial infarction) Active Problems:   Dementia   Hypotension   Dyspnea   Agitation   Dysphagia, unspecified(787.20)   Palliative care encounter   LABS: CBC:  Recent Labs  07/05/13 0515 07/06/13 0700  WBC 6.2 6.5  HGB 11.5* 11.7*  HCT 35.0* 36.2  MCV 80.5 81.2  PLT 171 188     PHYSICAL EXAM BP 99/44  Pulse 64  Temp(Src) 98.1 F (36.7 C) (Oral)  Resp 18  Ht 5\' 6"  (1.676 m)  Wt 130 lb 4.8 oz (59.104 kg)  BMI 21.04 kg/m2  SpO2 99% General: Well developed, well nourished, in no acute distress, frial Head: Eyes PERRLA, No xanthomas.   Normal cephalic and atramatic  Lungs: Clear bilaterally to auscultation and percussion. Heart: HRRR S1 S2, No MRG .  Pulses are 2+ & equal.            No carotid bruit. No JVD.  No abdominal bruits. No femoral bruits. Abdomen: Bowel sounds are positive, abdomen soft and non-tender without masses or                  Hernia's noted. Msk:  Back normal, normal gait. Normal strength and tone for age. Extremities: No clubbing, cyanosis or edema.  DP +1 Neuro: Alert and oriented X 3. Psych:  Good affect, responds appropriately    ASSESSMENT AND PLAN:  1. NSTEMI: No further complaints of chest pain. Labs are stable. No further cardiac testing is planned. Will be discharged to [facility]. Continue current medications as directed. No follow up in planned. Continue Plavix and ASA.   2. COPD: Stable on room air. Continue inhalers. Pallliative Care.  3. CHF: No over evidence of decompensation. Continue Lasix 20 mg daily as directed.   4. Dementia: Continues confused. Sitter is recommended at SNF if warranted.      Bettey Mare. Lyman Bishop NP [facility] 07/06/2013, 7:37 AM  Attending Note:   The patient was seen and examined.  Agree with assessment and plan as noted above.  Changes made to the above note as needed.  Pt is  doing well.  No angina.   Continue with medical therapy.   Vesta Mixer, Montez Hageman., MD, [facility] 07/06/2013, 10:08 AM

## 2013-07-06 NOTE — Discharge Summary (Signed)
Physician Discharge Summary  Patient ID: [name] MRN: [id number] DOB/AGE: August 28, 1933 79 y.o.  Admit date: 06/29/2013 Discharge date: 07/06/2013  Primary Discharge Diagnosis: NSTEMI Secondary Discharge Diagnosis 1. COPD 2. CHF 3. Dementia 4. Hypercholesterolemia  Significant Diagnostic Studies: Echocardiogram Left ventricle: Wall thickness was increased in a pattern of moderate LVH. There was mild focal basal hypertrophy of the septum. Systolic function was mildly reduced. The estimated ejection fraction was in the range of 45% to 50%. There is hypokinesis of the mid-distalanteroseptal myocardium. Doppler parameters are consistent with abnormal left ventricular relaxation (grade 1 diastolic dysfunction). - Mitral valve: Calcified annulus.      Consults: CSW for SNF placement  Hospital Course: 79 y/o with a history of HTN, HL, DM2, carotid stenosis, mild dementia and CAD transferred from [facility] for further evaluation of NSTEMI.  She has had multiple prior percutaneous coronary interventions. She had bare-metal stent to all 3 vessels. On Christmas day 2009, she presented with an anterior infarction with VF arrest and had a new XIENCE stent placed in the LAD. Three days later, she had 3 Endeavor stents placed in the right coronary artery for in-stent restenosis. There were 2 overlapping stents in the proximal and midvessel and distal stent placed. Her last cardiac catheterization was in May of 2010 and demonstrated 50-70% distal circumflex stenosis, patent LAD stents and 50% distal RCA in-stent restenosis. Ejection fraction is 60%.    Discussion for cardiac cath was completed and medical management was requested by family. She is confused and family members did not wish aggressive intervention. She as pain free throughout admission. CSW consulted for placement in SNF. She is planned to go to [facility] for Palliative Care. She will continue on current medical management. No  planned cardiology follow up.     Discharge Exam: Blood pressure 99/44, pulse 64, temperature 98.1 F (36.7 C), temperature source Oral, resp. rate 18, height 5\' 6"  (1.676 m), weight 130 lb 4.8 oz (59.104 kg), SpO2 99.00%.  Please seen progress note from this am.  Labs:   Lab Results  Component Value Date   WBC 6.5 07/06/2013   HGB 11.7* 07/06/2013   HCT 36.2 07/06/2013   MCV 81.2 07/06/2013   PLT 188 07/06/2013    Recent Labs Lab 06/30/13 1045  NA 139  K 3.9  CL 100  CO2 33*  BUN 9  CREATININE 0.74  CALCIUM 9.9  GLUCOSE 192*   Lab Results  Component Value Date   CKTOTAL 856* 12/19/2008   CKMB 45.5* 12/19/2008   TROPONINI 0.39* 07/01/2013    Lab Results  Component Value Date   CHOL 194 06/29/2013   CHOL  Value: 102        ATP III CLASSIFICATION:  <200     mg/dL   Desirable  409-811  mg/dL   Borderline High  >=914    mg/dL   High        7/82/9562   Lab Results  Component Value Date   HDL 39* 06/29/2013   HDL 44 01/25/8656   Lab Results  Component Value Date   LDLCALC 108* 06/29/2013   LDLCALC  Value: 42        Total Cholesterol/HDL:CHD Risk Coronary Heart Disease Risk Table                     Men   Women  1/2 Average Risk   3.4   3.3  Average Risk       5.0  4.4  2 X Average Risk   9.6   7.1  3 X Average Risk  23.4   11.0        Use the calculated Patient Ratio above and the CHD Risk Table to determine the patient's CHD Risk.        ATP III CLASSIFICATION (LDL):  <100     mg/dL   Optimal  010-272  mg/dL   Near or Above                    Optimal  130-159  mg/dL   Borderline  536-644  mg/dL   High  >034     mg/dL   Very High 7/42/5956   Lab Results  Component Value Date   TRIG 233* 06/29/2013   TRIG 82 05/05/2009   Lab Results  Component Value Date   CHOLHDL 5.0 06/29/2013   CHOLHDL 2.3 05/05/2009   No results found for this basename: LDLDIRECT      Radiology: Dg Chest Port 1 View  07/01/2013   *RADIOLOGY REPORT*  Clinical Data: Shortness of breath  PORTABLE CHEST - 1 VIEW   Comparison: 08/16/2012  Findings: Emphysematous changes and scattered fibrosis in the lungs.  Scarring in the right upper lung. The heart size and pulmonary vascularity are normal. The lungs appear clear and expanded without focal air space disease or consolidation. No blunting of the costophrenic angles.  No pneumothorax.  Mediastinal contours appear intact.  IMPRESSION: Emphysematous changes in the lungs.  No evidence of active pulmonary disease.   Original Report Authenticated By: Burman Nieves, M.D.    EKG: Sinus tachycardia with inferior lateral T-wave abnormalities.  FOLLOW UP PLANS AND APPOINTMENTS Discharge Orders   Future Orders Complete By Expires     Diet - low sodium heart healthy  As directed     Increase activity slowly  As directed         Medication List         acetaminophen 325 MG tablet  Commonly known as:  TYLENOL  Take 2 tablets (650 mg total) by mouth every 4 (four) hours as needed.     albuterol 108 (90 BASE) MCG/ACT inhaler  Commonly known as:  PROVENTIL HFA;VENTOLIN HFA  Inhale 2 puffs into the lungs every 6 (six) hours as needed for wheezing.     aspirin 325 MG EC tablet  Take 325 mg by mouth daily.     aspirin 81 MG tablet  Take 81 mg by mouth daily.     bisacodyl 10 MG suppository  Commonly known as:  DULCOLAX  Place 1 suppository (10 mg total) rectally as needed.     CELEXA 20 MG tablet  Generic drug:  citalopram  Take 20 mg by mouth daily.     CRESTOR 10 MG tablet  Generic drug:  rosuvastatin  Take 10 mg by mouth daily.     rosuvastatin 10 MG tablet  Commonly known as:  CRESTOR  Take 1 tablet (10 mg total) by mouth daily.     DEPAKOTE ER 250 MG 24 hr tablet  Generic drug:  divalproex  Take 250 mg by mouth daily.     furosemide 20 MG tablet  Commonly known as:  LASIX  Take 20 mg by mouth daily.     glipiZIDE 10 MG tablet  Commonly known as:  GLUCOTROL  Take 10 mg by mouth daily.     lisinopril 20 MG tablet  Commonly known as:   PRINIVIL,ZESTRIL  Take 20 mg by mouth daily.     Melatonin 3 MG Tabs  Take 3 mg by mouth at bedtime.     metoprolol tartrate 25 MG tablet  Commonly known as:  LOPRESSOR  Take 1 tablet (25 mg total) by mouth 2 (two) times daily.     NITROSTAT 0.4 MG SL tablet  Generic drug:  nitroGLYCERIN  Place 0.4 mg under the tongue every 5 (five) minutes as needed.     PLAVIX 75 MG tablet  Generic drug:  clopidogrel  Take 75 mg by mouth daily.     ramipril 5 MG capsule  Commonly known as:  ALTACE  Take 5 mg by mouth 2 (two) times daily.           Time spent with patient to include physician time:35 minutes. Signed: Joni Reining 07/06/2013, 7:55 AM Co-Sign MD  Attending Note:   The patient was seen and examined.  Agree with assessment and plan as noted above.  Changes made to the above note as needed.  See note from earlier today.   Vesta Mixer, Montez Hageman., MD, [facility] 07/06/2013, 10:09 AM

## 2013-12-17 ENCOUNTER — Encounter (HOSPITAL_COMMUNITY): Payer: Self-pay | Admitting: Emergency Medicine

## 2013-12-17 ENCOUNTER — Inpatient Hospital Stay (HOSPITAL_COMMUNITY)
Admission: EM | Admit: 2013-12-17 | Discharge: 2013-12-26 | DRG: 315 | Disposition: E | Payer: Medicare Other | Attending: Internal Medicine | Admitting: Internal Medicine

## 2013-12-17 ENCOUNTER — Emergency Department (HOSPITAL_COMMUNITY): Payer: Medicare Other

## 2013-12-17 DIAGNOSIS — T4595XA Adverse effect of unspecified primarily systemic and hematological agent, initial encounter: Secondary | ICD-10-CM | POA: Diagnosis present

## 2013-12-17 DIAGNOSIS — R0600 Dyspnea, unspecified: Secondary | ICD-10-CM

## 2013-12-17 DIAGNOSIS — Y9210 Unspecified residential institution as the place of occurrence of the external cause: Secondary | ICD-10-CM | POA: Diagnosis present

## 2013-12-17 DIAGNOSIS — S0003XA Contusion of scalp, initial encounter: Secondary | ICD-10-CM | POA: Diagnosis present

## 2013-12-17 DIAGNOSIS — Z9861 Coronary angioplasty status: Secondary | ICD-10-CM

## 2013-12-17 DIAGNOSIS — F0390 Unspecified dementia without behavioral disturbance: Secondary | ICD-10-CM | POA: Diagnosis present

## 2013-12-17 DIAGNOSIS — E785 Hyperlipidemia, unspecified: Secondary | ICD-10-CM | POA: Diagnosis present

## 2013-12-17 DIAGNOSIS — S301XXA Contusion of abdominal wall, initial encounter: Secondary | ICD-10-CM | POA: Diagnosis present

## 2013-12-17 DIAGNOSIS — R58 Hemorrhage, not elsewhere classified: Principal | ICD-10-CM | POA: Diagnosis present

## 2013-12-17 DIAGNOSIS — N179 Acute kidney failure, unspecified: Secondary | ICD-10-CM | POA: Diagnosis present

## 2013-12-17 DIAGNOSIS — J449 Chronic obstructive pulmonary disease, unspecified: Secondary | ICD-10-CM | POA: Diagnosis present

## 2013-12-17 DIAGNOSIS — R451 Restlessness and agitation: Secondary | ICD-10-CM

## 2013-12-17 DIAGNOSIS — W06XXXA Fall from bed, initial encounter: Secondary | ICD-10-CM | POA: Diagnosis present

## 2013-12-17 DIAGNOSIS — I252 Old myocardial infarction: Secondary | ICD-10-CM

## 2013-12-17 DIAGNOSIS — I739 Peripheral vascular disease, unspecified: Secondary | ICD-10-CM | POA: Diagnosis present

## 2013-12-17 DIAGNOSIS — Z515 Encounter for palliative care: Secondary | ICD-10-CM

## 2013-12-17 DIAGNOSIS — Z9181 History of falling: Secondary | ICD-10-CM

## 2013-12-17 DIAGNOSIS — I6529 Occlusion and stenosis of unspecified carotid artery: Secondary | ICD-10-CM | POA: Diagnosis present

## 2013-12-17 DIAGNOSIS — Z66 Do not resuscitate: Secondary | ICD-10-CM | POA: Diagnosis present

## 2013-12-17 DIAGNOSIS — R188 Other ascites: Secondary | ICD-10-CM | POA: Diagnosis present

## 2013-12-17 DIAGNOSIS — E871 Hypo-osmolality and hyponatremia: Secondary | ICD-10-CM | POA: Diagnosis present

## 2013-12-17 DIAGNOSIS — I10 Essential (primary) hypertension: Secondary | ICD-10-CM | POA: Diagnosis present

## 2013-12-17 DIAGNOSIS — J4489 Other specified chronic obstructive pulmonary disease: Secondary | ICD-10-CM | POA: Diagnosis present

## 2013-12-17 DIAGNOSIS — D62 Acute posthemorrhagic anemia: Secondary | ICD-10-CM

## 2013-12-17 DIAGNOSIS — I471 Supraventricular tachycardia: Secondary | ICD-10-CM

## 2013-12-17 DIAGNOSIS — Z7982 Long term (current) use of aspirin: Secondary | ICD-10-CM

## 2013-12-17 DIAGNOSIS — D649 Anemia, unspecified: Secondary | ICD-10-CM

## 2013-12-17 DIAGNOSIS — E119 Type 2 diabetes mellitus without complications: Secondary | ICD-10-CM | POA: Diagnosis present

## 2013-12-17 DIAGNOSIS — Z8673 Personal history of transient ischemic attack (TIA), and cerebral infarction without residual deficits: Secondary | ICD-10-CM

## 2013-12-17 DIAGNOSIS — K661 Hemoperitoneum: Secondary | ICD-10-CM

## 2013-12-17 DIAGNOSIS — R000 Tachycardia, unspecified: Secondary | ICD-10-CM | POA: Diagnosis present

## 2013-12-17 DIAGNOSIS — I959 Hypotension, unspecified: Secondary | ICD-10-CM

## 2013-12-17 DIAGNOSIS — I2510 Atherosclerotic heart disease of native coronary artery without angina pectoris: Secondary | ICD-10-CM | POA: Diagnosis present

## 2013-12-17 LAB — COMPREHENSIVE METABOLIC PANEL
ALT: 5 U/L (ref 0–35)
AST: 12 U/L (ref 0–37)
CO2: 30 mEq/L (ref 19–32)
Chloride: 89 mEq/L — ABNORMAL LOW (ref 96–112)
Creatinine, Ser: 2.5 mg/dL — ABNORMAL HIGH (ref 0.50–1.10)
GFR calc non Af Amer: 17 mL/min — ABNORMAL LOW (ref >90)
Sodium: 129 mEq/L — ABNORMAL LOW (ref 135–145)
Total Bilirubin: 0.4 mg/dL (ref 0.3–1.2)

## 2013-12-17 LAB — CBC WITH DIFFERENTIAL/PLATELET
Basophils Absolute: 0 10*3/uL (ref 0.0–0.1)
HCT: 17.7 % — ABNORMAL LOW (ref 36.0–46.0)
Lymphocytes Relative: 13 % (ref 12–46)
Monocytes Absolute: 1.4 10*3/uL — ABNORMAL HIGH (ref 0.1–1.0)
Neutro Abs: 7.9 10*3/uL — ABNORMAL HIGH (ref 1.7–7.7)
RBC: 2.2 MIL/uL — ABNORMAL LOW (ref 3.87–5.11)
RDW: 14.8 % (ref 11.5–15.5)
WBC: 10.6 10*3/uL — ABNORMAL HIGH (ref 4.0–10.5)

## 2013-12-17 LAB — PREPARE RBC (CROSSMATCH)

## 2013-12-17 LAB — TROPONIN I: Troponin I: <0.3 ng/mL (ref <0.30)

## 2013-12-17 LAB — PROTIME-INR
INR: 1.25 (ref 0.00–1.49)
Prothrombin Time: 15.4 seconds — ABNORMAL HIGH (ref 11.6–15.2)

## 2013-12-17 MED ORDER — SODIUM CHLORIDE 0.9 % IV BOLUS (SEPSIS)
500.0000 mL | Freq: Once | INTRAVENOUS | Status: AC
  Administered 2013-12-17: via INTRAVENOUS

## 2013-12-17 NOTE — ED Notes (Signed)
CRITICAL VALUE ALERT  Critical value received:  Boyd Kerbs Iesha Summerhill  Date of notification:  2014-01-06  Time of notification:  2327  Critical value read back:yes  Nurse who received alert:  Bennetta Laos  MD notified (1st page):    Time of first page:  2327  MD notified (2nd page):  Time of second page:  Responding MD:  Fayrene Fearing  Time MD responded:  [phone]

## 2013-12-17 NOTE — ED Provider Notes (Addendum)
CSN: [id number]     Arrival date & time 12/03/2013  2230 History  This chart was scribed for Rolland Porter, MD by Dorothey Baseman, ED Scribe. This patient was seen in room APA02/APA02 and the patient's care was started at 10:57 PM.    Chief Complaint  Patient presents with  . Anemia    low blood per [facility]  . Fall    hematoma to forehead   The history is provided by the patient and a relative (daughter). No language interpreter was used.   Level 5 caveat- altered mental status (dementia)   HPI Comments: PCP is Dr. Katrinka Blazing.  Pt is a resident at [facility], [name] is a 79 y.o. female with a history of stroke, MI, and dementia who presents to the Emergency Department complaining of a fall from the bed that occurred last night. Her daughter reports that the patient has been complaining of nausea and had one episode of emesis earlier today. Patient also presents to the ED with low blood count consistent with anemia. Her daughter reports that the patient has not had any anemic complications for several years. She denies any headache, weakness, fatigue, hematemesis, blood in the stool, or any other symptoms at this time. Patient also has a history of CAD, hypotension, PAD, HTN, DM, and COPD.   Past Medical History  Diagnosis Date  . Coronary artery disease     multivessel  . Hypotension   . Peripheral artery disease   . Hypertension     hx  . Diabetes mellitus, type 2   . Dyslipidemia   . Tobacco user     remote  . Stroke   . COPD (chronic obstructive pulmonary disease)   . Dementia    History reviewed. No pertinent past surgical history. History reviewed. No pertinent family history. History  Substance Use Topics  . Smoking status: Never Smoker   . Smokeless tobacco: Not on file  . Alcohol Use: No   OB History   Grav Para Term Preterm Abortions TAB SAB Ect Mult Living                 Review of Systems  Unable to perform ROS   Allergies  Atorvastatin; Other;  and Penicillins  Home Medications   Current Outpatient Rx  Name  Route  Sig  Dispense  Refill  . acetaminophen (TYLENOL) 325 MG tablet   Oral   Take 2 tablets (650 mg total) by mouth every 4 (four) hours as needed.         Marland Kitchen albuterol (PROVENTIL HFA;VENTOLIN HFA) 108 (90 BASE) MCG/ACT inhaler   Inhalation   Inhale 2 puffs into the lungs every 6 (six) hours as needed for wheezing.         Marland Kitchen aspirin 81 MG chewable tablet   Oral   Chew 81 mg by mouth daily.         . bisacodyl (DULCOLAX) 10 MG suppository   Rectal   Place 1 suppository (10 mg total) rectally as needed.   12 suppository   0   . citalopram (CELEXA) 20 MG tablet   Oral   Take 20 mg by mouth daily.           . clopidogrel (PLAVIX) 75 MG tablet   Oral   Take 75 mg by mouth daily.           . divalproex (DEPAKOTE ER) 250 MG 24 hr tablet   Oral   Take  250 mg by mouth 2 (two) times daily.          . furosemide (LASIX) 20 MG tablet   Oral   Take 20 mg by mouth daily.         Marland Kitchen glipiZIDE (GLUCOTROL) 10 MG tablet   Oral   Take 10 mg by mouth daily.           . Lactobacillus (ACIDOPHILUS PO)   Oral   Take 1 capsule by mouth 2 (two) times daily. 2 week course starting on 12/16/13         . levofloxacin (LEVAQUIN) 500 MG tablet   Oral   Take 500 mg by mouth daily. 10 day course starting on 01-11-2014         . lisinopril (PRINIVIL,ZESTRIL) 20 MG tablet   Oral   Take 20 mg by mouth daily.         . Melatonin 3 MG TABS   Oral   Take 3 mg by mouth at bedtime.         . nitroGLYCERIN (NITROSTAT) 0.4 MG SL tablet   Sublingual   Place 0.4 mg under the tongue every 5 (five) minutes as needed.            Triage Vitals: BP 76/37  Pulse 93  Temp(Src) 98.3 F (36.8 C) (Oral)  Resp 22  SpO2 100%  Physical Exam  Constitutional: She is oriented to person, place, and time. She appears well-developed and well-nourished. No distress.  HENT:  Head: Normocephalic.  Eyes: Pupils are  equal, round, and reactive to light. No scleral icterus.  Pale conjunctiva.   Neck: Normal range of motion. Neck supple. No thyromegaly present.  Cardiovascular: Normal rate, regular rhythm and normal heart sounds.  Exam reveals no gallop and no friction rub.   No murmur heard. Pulmonary/Chest: Effort normal and breath sounds normal. No respiratory distress. She has no wheezes. She has no rales.  Abdominal: Soft. Bowel sounds are normal. She exhibits no distension. There is no tenderness. There is no rebound.  Genitourinary:  No stool.   Musculoskeletal: Normal range of motion.  Neurological: She is alert and oriented to person, place, and time.  Dementia. Moving all 4 extremities well.   Skin: Skin is warm and dry. No rash noted. There is pallor.  Nailbeds are pale.   Psychiatric: She has a normal mood and affect. Her behavior is normal.    ED Course  CRITICAL CARE Performed by: Rolland Porter Authorized by: Rolland Porter Total critical care time: 60 (60) minutes Critical care time was exclusive of separately billable procedures and treating other patients. Critical care was necessary to treat or prevent imminent or life-threatening deterioration of the following conditions: shock. Critical care was time spent personally by me on the following activities: development of treatment plan with patient or surrogate, evaluation of patient's response to treatment, examination of patient, obtaining history from patient or surrogate, ordering and performing treatments and interventions and ordering and review of laboratory studies. Comments: Patient given multiple crystalloid boluses. Is being transfused in the emergency department. I obtained consent from her power of attorney for her blood transfusions. She has responded to the above treatment with improved perfusion, and improving vital signs.   (including critical care time)  DIAGNOSTIC STUDIES: Oxygen Saturation is 100% on room air, normal by  my interpretation.    COORDINATION OF CARE: 11:05 PM- Ordered blood labs, UA, and a CT of the head. Will order IV fluids to manage  symptoms. Discussed treatment plan with patient at bedside and patient verbalized agreement.     Labs Review Labs Reviewed  CBC WITH DIFFERENTIAL - Abnormal; Notable for the following:    WBC 10.6 (*)    RBC 2.20 (*)    Hemoglobin 5.7 (*)    HCT 17.7 (*)    MCH 25.9 (*)    Neutro Abs 7.9 (*)    Monocytes Relative 13 (*)    Monocytes Absolute 1.4 (*)    All other components within normal limits  COMPREHENSIVE METABOLIC PANEL - Abnormal; Notable for the following:    Sodium 129 (*)    Chloride 89 (*)    Glucose, Bld 206 (*)    BUN 31 (*)    Creatinine, Ser 2.50 (*)    Total Protein 5.9 (*)    Albumin 2.9 (*)    Alkaline Phosphatase 37 (*)    GFR calc non Af Amer 17 (*)    GFR calc Af Amer 20 (*)    All other components within normal limits  PROTIME-INR - Abnormal; Notable for the following:    Prothrombin Time 15.4 (*)    All other components within normal limits  URINALYSIS, ROUTINE W REFLEX MICROSCOPIC - Abnormal; Notable for the following:    APPearance HAZY (*)    Bilirubin Urine SMALL (*)    All other components within normal limits  URINE CULTURE  TROPONIN I  OCCULT BLOOD X 1 CARD TO LAB, STOOL  OCCULT BLOOD, POC DEVICE  PREPARE RBC (CROSSMATCH)  TYPE AND SCREEN  ABO/RH   Imaging Review Ct Head Wo Contrast  12/18/2013   CLINICAL DATA:  Headache and vertigo; recent fall. Nausea and vomiting.  EXAM: CT HEAD WITHOUT CONTRAST  TECHNIQUE: Contiguous axial images were obtained from the base of the skull through the vertex without intravenous contrast.  COMPARISON:  CT of the head performed 09/29/2012  FINDINGS: There is no evidence of acute infarction, mass lesion, or intra- or extra-axial hemorrhage on CT. Evaluation is mildly suboptimal due to motion artifact.  Scattered periventricular and subcortical white matter change likely reflects  small vessel ischemic microangiopathy.  The posterior fossa, including the cerebellum, brainstem and fourth ventricle, is within normal limits. The third and lateral ventricles, and basal ganglia are unremarkable in appearance. The cerebral hemispheres are symmetric in appearance, with normal gray-white differentiation. No mass effect or midline shift is seen.  There is no evidence of fracture; visualized osseous structures are unremarkable in appearance. The orbits are within normal limits. The paranasal sinuses and mastoid air cells are well-aerated. No significant soft tissue abnormalities are seen.  IMPRESSION: 1. No evidence of traumatic intracranial injury or fracture. 2. Scattered small vessel ischemic microangiopathy.   Electronically Signed   By: Roanna Raider M.D.   On: 12/18/2013 00:23    EKG Interpretation    Date/Time:  Tuesday December 17 2013 23:29:35 EST Ventricular Rate:  93 PR Interval:  142 QRS Duration: 82 QT Interval:  362 QTC Calculation: 450 R Axis:   81 Text Interpretation:  Sinus rhythm with Premature atrial complexes Otherwise normal ECG When compared with ECG of 01-Jul-2013 01:42, Premature atrial complexes are now Present Anterior q waves Confirmed by Fayrene Fearing  MD, Sumire Halbleib ([id number]) on 12/18/2013 1:03:27 AM            MDM   1. Hypotension   2. Anemia   3. Acute kidney injury    Hypotension I think is multifactorial with acute volume loss from her vomiting  with her anemia. Her anemia is normocytic. She is guaiac negative. Bili is normal. No signs that she is hemolyzing in. No signs of acute loss. She is episode of emesis here that is not bloody. No reported blood loss. She responded to crystalloid boluses. After 1400 cc her pressure was 98. She is receiving units 1 units 2 of packed red blood cells in the emergency room. Her sugars improved to 110 systolic. Heart rate is 95 I discussed the case with hospitalist Dr. Sharl Ma. Dr. Reynolds Bowl has seen and evaluated the patient. We  have discussed the patient. She again had a deterioration of her blood pressure. She was down to 90 systolic.  Heart rate is 110. She had an initial episode of vomiting. It is heme-negative. Repeat abdominal exam does show some tenderness in left abdomen. No obvious mass or pulsation.   Repeat hemoglobin was obtained. Her hemoglobin is improved to 7.2 before completion of her second unit. CT shows some fluid around the liver and spleen in the upper abdomen. It shows what appears to be hematoma in the pelvis. No pelvis fracture. No solid organ injury.  I did have a long discussion with the patient's son and daughter. He ate dinner throughout her ED course. Daughter does have power of attorney. She states her mother is DO NOT RESUSCITATE and that she does not want to have CPR or be intubated. However she states that she thinks her mother would want to have the surgery if it was felt to be necessary. Patient states that she would want that for her mother as well.  Discussion:  She is on Plavix. She has had more than one fall the last 24 hours. She is hypotensive and acutely anemic. She has fluid in her pelvis suggestive of hemorrhage. Possibilities include retroperitoneal hemorrhage. Small bowel or large bowel injury. I discussed the case initially with the intensivist Dr. Saturday. After completion of her CT scan I discussed case with Dr.             trauma services.  I also discussed the case with my partner Dr. Veverly Fells.  Patient being transferred to higher level of care [facility] hospital emergency room. She'll be evaluated by Dr.         of trauma.    At this point I would  consider her an initial responder. Her most recent blood pressure is 120 heart rate is 115.  She is awake alert.  I personally performed the services described in this documentation, which was scribed in my presence. The recorded information has been reviewed and is accurate.     Rolland Porter, MD 12/18/13 1610  Rolland Porter,  MD 12/18/13 [phone]

## 2013-12-18 ENCOUNTER — Inpatient Hospital Stay (HOSPITAL_COMMUNITY): Payer: Medicare Other

## 2013-12-18 ENCOUNTER — Encounter (HOSPITAL_COMMUNITY): Payer: Self-pay | Admitting: Emergency Medicine

## 2013-12-18 DIAGNOSIS — I498 Other specified cardiac arrhythmias: Secondary | ICD-10-CM

## 2013-12-18 DIAGNOSIS — R58 Hemorrhage, not elsewhere classified: Principal | ICD-10-CM

## 2013-12-18 DIAGNOSIS — D62 Acute posthemorrhagic anemia: Secondary | ICD-10-CM | POA: Diagnosis present

## 2013-12-18 DIAGNOSIS — I959 Hypotension, unspecified: Secondary | ICD-10-CM

## 2013-12-18 DIAGNOSIS — S37009A Unspecified injury of unspecified kidney, initial encounter: Secondary | ICD-10-CM

## 2013-12-18 DIAGNOSIS — N179 Acute kidney failure, unspecified: Secondary | ICD-10-CM | POA: Diagnosis present

## 2013-12-18 DIAGNOSIS — E871 Hypo-osmolality and hyponatremia: Secondary | ICD-10-CM

## 2013-12-18 DIAGNOSIS — S301XXA Contusion of abdominal wall, initial encounter: Secondary | ICD-10-CM

## 2013-12-18 DIAGNOSIS — W19XXXA Unspecified fall, initial encounter: Secondary | ICD-10-CM

## 2013-12-18 LAB — CBC WITH DIFFERENTIAL/PLATELET
Basophils Relative: 0 % (ref 0–1)
Eosinophils Absolute: 0.1 10*3/uL (ref 0.0–0.7)
HCT: 27.1 % — ABNORMAL LOW (ref 36.0–46.0)
MCH: 28.3 pg (ref 26.0–34.0)
MCHC: 33.6 g/dL (ref 30.0–36.0)
Neutro Abs: 8.7 10*3/uL — ABNORMAL HIGH (ref 1.7–7.7)
Neutrophils Relative %: 70 % (ref 43–77)
Platelets: 210 10*3/uL (ref 150–400)
RBC: 3.22 MIL/uL — ABNORMAL LOW (ref 3.87–5.11)
WBC: 12.3 10*3/uL — ABNORMAL HIGH (ref 4.0–10.5)

## 2013-12-18 LAB — CBC
HCT: 21.8 % — ABNORMAL LOW (ref 36.0–46.0)
HCT: 29.9 % — ABNORMAL LOW (ref 36.0–46.0)
HCT: 31 % — ABNORMAL LOW (ref 36.0–46.0)
Hemoglobin: 9.7 g/dL — ABNORMAL LOW (ref 12.0–15.0)
Hemoglobin: 9.9 g/dL — ABNORMAL LOW (ref 12.0–15.0)
MCH: 27.2 pg (ref 26.0–34.0)
MCH: 27.5 pg (ref 26.0–34.0)
MCH: 27.3 pg (ref 26.0–34.0)
MCHC: 33 g/dL (ref 30.0–36.0)
MCV: 82.9 fL (ref 78.0–100.0)
MCV: 85.6 fL (ref 78.0–100.0)
Platelets: 239 10*3/uL (ref 150–400)
Platelets: 269 10*3/uL (ref 150–400)
RBC: 2.63 MIL/uL — ABNORMAL LOW (ref 3.87–5.11)
RBC: 3.93 MIL/uL (ref 3.87–5.11)
RBC: 3.53 MIL/uL — ABNORMAL LOW (ref 3.87–5.11)
RBC: 3.62 MIL/uL — ABNORMAL LOW (ref 3.87–5.11)
WBC: 12.1 10*3/uL — ABNORMAL HIGH (ref 4.0–10.5)
WBC: 13.6 10*3/uL — ABNORMAL HIGH (ref 4.0–10.5)
WBC: 12.7 10*3/uL — ABNORMAL HIGH (ref 4.0–10.5)
WBC: 13.7 10*3/uL — ABNORMAL HIGH (ref 4.0–10.5)

## 2013-12-18 LAB — URINALYSIS, ROUTINE W REFLEX MICROSCOPIC
Hgb urine dipstick: NEGATIVE
Ketones, ur: NEGATIVE mg/dL
Nitrite: NEGATIVE
Protein, ur: NEGATIVE mg/dL
Specific Gravity, Urine: 1.025 (ref 1.005–1.030)
Urobilinogen, UA: 0.2 mg/dL (ref 0.0–1.0)
pH: 5 (ref 5.0–8.0)

## 2013-12-18 LAB — BASIC METABOLIC PANEL
CO2: 32 mEq/L (ref 19–32)
Calcium: 9.4 mg/dL (ref 8.4–10.5)
Chloride: 101 mEq/L (ref 96–112)
Creatinine, Ser: 0.71 mg/dL (ref 0.50–1.10)
Glucose, Bld: 153 mg/dL — ABNORMAL HIGH (ref 70–99)
Potassium: 4.4 mEq/L (ref 3.5–5.1)

## 2013-12-18 LAB — GLUCOSE, CAPILLARY
Glucose-Capillary: 116 mg/dL — ABNORMAL HIGH (ref 70–99)
Glucose-Capillary: 121 mg/dL — ABNORMAL HIGH (ref 70–99)

## 2013-12-18 LAB — TROPONIN I
Troponin I: <0.3 ng/mL (ref <0.30)
Troponin I: <0.3 ng/mL (ref <0.30)
Troponin I: <0.3 ng/mL (ref <0.30)

## 2013-12-18 LAB — APTT: aPTT: 31 seconds (ref 24–37)

## 2013-12-18 LAB — PREPARE RBC (CROSSMATCH)

## 2013-12-18 LAB — PHOSPHORUS: Phosphorus: 2.6 mg/dL (ref 2.3–4.6)

## 2013-12-18 LAB — PROTIME-INR: INR: 1.14 (ref 0.00–1.49)

## 2013-12-18 MED ORDER — ONDANSETRON HCL 4 MG/2ML IJ SOLN
INTRAMUSCULAR | Status: AC
  Filled 2013-12-18: qty 2

## 2013-12-18 MED ORDER — BISACODYL 10 MG RE SUPP
10.0000 mg | Freq: Every day | RECTAL | Status: DC | PRN
Start: 2013-12-18 — End: 2013-12-21

## 2013-12-18 MED ORDER — METOPROLOL TARTRATE 1 MG/ML IV SOLN
2.5000 mg | INTRAVENOUS | Status: DC | PRN
  Administered 2013-12-18 – 2013-12-19 (×3): 2.5 mg via INTRAVENOUS
  Filled 2013-12-18 (×2): qty 5

## 2013-12-18 MED ORDER — SODIUM CHLORIDE 0.9 % IV SOLN: INTRAVENOUS | Status: AC

## 2013-12-18 MED ORDER — ALBUTEROL SULFATE HFA 108 (90 BASE) MCG/ACT IN AERS: 2.0000 | INHALATION_SPRAY | Freq: Four times a day (QID) | RESPIRATORY_TRACT | Status: DC | PRN

## 2013-12-18 MED ORDER — METOPROLOL TARTRATE 1 MG/ML IV SOLN
INTRAVENOUS | Status: AC
  Filled 2013-12-18: qty 5

## 2013-12-18 MED ORDER — METOPROLOL TARTRATE 1 MG/ML IV SOLN
2.5000 mg | Freq: Four times a day (QID) | INTRAVENOUS | Status: DC | PRN
  Filled 2013-12-18: qty 5

## 2013-12-18 MED ORDER — HALOPERIDOL 0.5 MG PO TABS
0.5000 mg | ORAL_TABLET | Freq: Three times a day (TID) | ORAL | Status: DC | PRN
  Administered 2013-12-18: 0.5 mg via ORAL
  Filled 2013-12-18: qty 1

## 2013-12-18 MED ORDER — CITALOPRAM HYDROBROMIDE 20 MG PO TABS
20.0000 mg | ORAL_TABLET | Freq: Every day | ORAL | Status: DC
  Administered 2013-12-19: 20 mg via ORAL
  Filled 2013-12-18 (×2): qty 1

## 2013-12-18 MED ORDER — ONDANSETRON HCL 4 MG/2ML IJ SOLN
4.0000 mg | Freq: Once | INTRAMUSCULAR | Status: AC
  Administered 2013-12-18: 4 mg via INTRAMUSCULAR

## 2013-12-18 MED ORDER — SODIUM CHLORIDE 0.9 % IV BOLUS (SEPSIS): 500.0000 mL | Freq: Once | INTRAVENOUS | Status: DC

## 2013-12-18 MED ORDER — DIVALPROEX SODIUM ER 250 MG PO TB24
250.0000 mg | ORAL_TABLET | Freq: Two times a day (BID) | ORAL | Status: DC
  Administered 2013-12-18 – 2013-12-19 (×2): 250 mg via ORAL
  Filled 2013-12-18 (×5): qty 1

## 2013-12-18 NOTE — ED Notes (Signed)
Trauma, PA at bedside

## 2013-12-18 NOTE — Progress Notes (Signed)
Called by resident due to pt with increasing HR and RR.  Pt examined and chart reviewed.  Large RP hematoma out of pelvis with serous density fluid in abdomen.  No HOTN.  HGB down 1 gram from am.  Abdomen protuberant but non tender.  No peritonitis.  Pt best managed in SDU or ICU by ICU team.  Follow H/H and transfuse to keep hgb greater than 9.  If unstable,  Will need pelvic angiogram by IR.  No dominant role for operative exploration due to creation of unstable situation by violating hematoma. High mortality associated with operative exploration.  Most of these will require blood products and appropriate resusitation and resolve.  Hold all types of anticoagulation.

## 2013-12-18 NOTE — Consult Note (Signed)
Reason for Consult:Abdominal hematoma Referring Physician: Danila [name] is an 79 y.o. female.  HPI: [name] was transferred from [facility]. She is demented and cannot contribute much to history. Her son is present and gives a history of a gradual decline over the last 3 weeks with weakness, fatigue, and near-syncope and/or syncope. About 2-3 days ago she fell in the bathroom and struck her head. She was evaluated at [facility] and found to be significantly anemic with e/o acute kidney injury. A CT of the abdomen and pelvis showed a large hematoma in the lower left abdomen. It is unclear whether it is intraperitoneal or retroperitoneal. There is also lower attenuation fluid noted around the liver and spleen and in the pelvis.  Past Medical History  Diagnosis Date  . Coronary artery disease     multivessel  . Hypotension   . Peripheral artery disease   . Hypertension     hx  . Diabetes mellitus, type 2   . Dyslipidemia   . Tobacco user     remote  . Stroke   . COPD (chronic obstructive pulmonary disease)   . Dementia     History reviewed. No pertinent past surgical history.  History reviewed. No pertinent family history.  Social History:  reports that she has never smoked. She does not have any smokeless tobacco history on file. She reports that she does not drink alcohol or use illicit drugs.  Allergies:  Allergies  Allergen Reactions  . Atorvastatin     REACTION: myalgia  . Other     Reductase Inhibitors   . Penicillins     REACTION: rash    Medications: I have reviewed the patient's current medications.  Results for orders placed during the hospital encounter of 01-06-14 (from the past 48 hour(s))  ABO/RH     Status: None   Collection Time    January 06, 2014 10:50 PM      Result Value Range   ABO/RH(D) O NEG    CBC WITH DIFFERENTIAL     Status: Abnormal   Collection Time    01-06-2014 10:58 PM      Result Value Range   WBC 10.6 (*) 4.0 - 10.5 K/uL   RBC 2.20 (*) 3.87 -  5.11 MIL/uL   Hemoglobin 5.7 (*) 12.0 - 15.0 g/dL   Comment: RESULT REPEATED AND VERIFIED     CRITICAL RESULT CALLED TO, READ BACK BY AND VERIFIED WITH:     BELTON K AT 2325 ON [id number] BY FORSYTH K   HCT 17.7 (*) 36.0 - 46.0 %   MCV 80.5  78.0 - 100.0 fL   MCH 25.9 (*) 26.0 - 34.0 pg   MCHC 32.2  30.0 - 36.0 g/dL   RDW 62.1  30.8 - 65.7 %   Platelets 236  150 - 400 K/uL   Neutrophils Relative % 74  43 - 77 %   Neutro Abs 7.9 (*) 1.7 - 7.7 K/uL   Lymphocytes Relative 13  12 - 46 %   Lymphs Abs 1.3  0.7 - 4.0 K/uL   Monocytes Relative 13 (*) 3 - 12 %   Monocytes Absolute 1.4 (*) 0.1 - 1.0 K/uL   Eosinophils Relative 0  0 - 5 %   Eosinophils Absolute 0.0  0.0 - 0.7 K/uL   Basophils Relative 0  0 - 1 %   Basophils Absolute 0.0  0.0 - 0.1 K/uL  COMPREHENSIVE METABOLIC PANEL     Status: Abnormal   Collection  Time    12/01/2013 11:16 PM      Result Value Range   Sodium 129 (*) 135 - 145 mEq/L   Potassium 4.1  3.5 - 5.1 mEq/L   Chloride 89 (*) 96 - 112 mEq/L   CO2 30  19 - 32 mEq/L   Glucose, Bld 206 (*) 70 - 99 mg/dL   BUN 31 (*) 6 - 23 mg/dL   Creatinine, Ser 4.09 (*) 0.50 - 1.10 mg/dL   Calcium 9.7  8.4 - 81.1 mg/dL   Total Protein 5.9 (*) 6.0 - 8.3 g/dL   Albumin 2.9 (*) 3.5 - 5.2 g/dL   AST 12  0 - 37 U/L   ALT 5  0 - 35 U/L   Alkaline Phosphatase 37 (*) 39 - 117 U/L   Total Bilirubin 0.4  0.3 - 1.2 mg/dL   GFR calc non Af Amer 17 (*) >90 mL/min   GFR calc Af Amer 20 (*) >90 mL/min   Comment: (NOTE)     The eGFR has been calculated using the CKD EPI equation.     This calculation has not been validated in all clinical situations.     eGFR's persistently <90 mL/min signify possible Chronic Kidney     Disease.  PROTIME-INR     Status: Abnormal   Collection Time    12/05/2013 11:16 PM      Result Value Range   Prothrombin Time 15.4 (*) 11.6 - 15.2 seconds   INR 1.25  0.00 - 1.49  TROPONIN I     Status: None   Collection Time    12/13/2013 11:16 PM      Result Value Range    Troponin I <0.30  <0.30 ng/mL   Comment:            Due to the release kinetics of cTnI,     a negative result within the first hours     of the onset of symptoms does not rule out     myocardial infarction with certainty.     If myocardial infarction is still suspected,     repeat the test at appropriate intervals.  PREPARE RBC (CROSSMATCH)     Status: None   Collection Time    11/29/2013 11:16 PM      Result Value Range   Order Confirmation ORDER PROCESSED BY BLOOD BANK    TYPE AND SCREEN     Status: None   Collection Time    12/16/2013 11:16 PM      Result Value Range   ABO/RH(D) O NEG     Antibody Screen NEG     Sample Expiration 12/20/2013     Unit Number [id number]     Blood Component Type RED CELLS,LR     Unit division 00     Status of Unit ISSUED     Transfusion Status OK TO TRANSFUSE     Crossmatch Result Compatible     Unit Number [id number]     Blood Component Type RBC LR PHER1     Unit division 00     Status of Unit ISSUED     Transfusion Status OK TO TRANSFUSE     Crossmatch Result Compatible    OCCULT BLOOD, POC DEVICE     Status: None   Collection Time    12/20/2013 11:47 PM      Result Value Range   Fecal Occult Bld NEGATIVE  NEGATIVE  URINALYSIS, ROUTINE W REFLEX MICROSCOPIC     Status:  Abnormal   Collection Time    12/18/13 12:03 AM      Result Value Range   Color, Urine YELLOW  YELLOW   APPearance HAZY (*) CLEAR   Specific Gravity, Urine 1.025  1.005 - 1.030   pH 5.0  5.0 - 8.0   Glucose, UA NEGATIVE  NEGATIVE mg/dL   Hgb urine dipstick NEGATIVE  NEGATIVE   Bilirubin Urine SMALL (*) NEGATIVE   Ketones, ur NEGATIVE  NEGATIVE mg/dL   Protein, ur NEGATIVE  NEGATIVE mg/dL   Urobilinogen, UA 0.2  0.0 - 1.0 mg/dL   Nitrite NEGATIVE  NEGATIVE   Leukocytes, UA NEGATIVE  NEGATIVE   Comment: MICROSCOPIC NOT DONE ON URINES WITH NEGATIVE PROTEIN, BLOOD, LEUKOCYTES, NITRITE, OR GLUCOSE <1000 mg/dL.  LACTIC ACID, PLASMA     Status: None   Collection Time     12/18/13  1:38 AM      Result Value Range   Lactic Acid, Venous 1.2  0.5 - 2.2 mmol/L  CBC     Status: Abnormal   Collection Time    12/18/13  1:38 AM      Result Value Range   WBC 12.1 (*) 4.0 - 10.5 K/uL   RBC 2.63 (*) 3.87 - 5.11 MIL/uL   Hemoglobin 7.2 (*) 12.0 - 15.0 g/dL   Comment: DELTA CHECK NOTED     POST TRANSFUSION SPECIMEN   HCT 21.8 (*) 36.0 - 46.0 %   MCV 82.9  78.0 - 100.0 fL   MCH 27.4  26.0 - 34.0 pg   MCHC 33.0  30.0 - 36.0 g/dL   RDW 16.1  09.6 - 04.5 %   Platelets 206  150 - 400 K/uL  PREPARE RBC (CROSSMATCH)     Status: None   Collection Time    12/18/13  3:00 AM      Result Value Range   Order Confirmation ORDER PROCESSED BY BLOOD BANK    CBC WITH DIFFERENTIAL     Status: Abnormal   Collection Time    12/18/13  3:20 AM      Result Value Range   WBC 12.3 (*) 4.0 - 10.5 K/uL   RBC 3.22 (*) 3.87 - 5.11 MIL/uL   Hemoglobin 9.1 (*) 12.0 - 15.0 g/dL   Comment: DELTA CHECK NOTED     POST TRANSFUSION SPECIMEN   HCT 27.1 (*) 36.0 - 46.0 %   MCV 84.2  78.0 - 100.0 fL   MCH 28.3  26.0 - 34.0 pg   MCHC 33.6  30.0 - 36.0 g/dL   RDW 40.9  81.1 - 91.4 %   Platelets 210  150 - 400 K/uL   Neutrophils Relative % 70  43 - 77 %   Neutro Abs 8.7 (*) 1.7 - 7.7 K/uL   Lymphocytes Relative 14  12 - 46 %   Lymphs Abs 1.7  0.7 - 4.0 K/uL   Monocytes Relative 15 (*) 3 - 12 %   Monocytes Absolute 1.9 (*) 0.1 - 1.0 K/uL   Eosinophils Relative 1  0 - 5 %   Eosinophils Absolute 0.1  0.0 - 0.7 K/uL   Basophils Relative 0  0 - 1 %   Basophils Absolute 0.0  0.0 - 0.1 K/uL    Ct Abdomen Pelvis Wo Contrast  12/18/2013   CLINICAL DATA:  Abdominal pain and hematuria.  Anemia.  EXAM: CT ABDOMEN AND PELVIS WITHOUT CONTRAST  TECHNIQUE: Multidetector CT imaging of the abdomen and pelvis was performed following the standard  protocol without intravenous contrast.  COMPARISON:  Lumbar spine radiographs performed 09/24/2012  FINDINGS: Trace bilateral pleural effusions are noted, with  mild right basilar atelectasis. Diffuse coronary artery calcifications are seen.  The liver and spleen are unremarkable in appearance. The gallbladder is within normal limits. The pancreas and adrenal glands are unremarkable.  There is mild bilateral renal atrophy. The kidneys are otherwise unremarkable in appearance. There is no evidence of hydronephrosis. No renal or ureteral stones are seen. No perinephric stranding is appreciated.  No free fluid is identified. The small bowel is unremarkable in appearance. The stomach is within normal limits. No acute vascular abnormalities are seen. Diffuse calcification is noted along the abdominal aorta and its branches.  A large acute hematoma is noted at the left side of the upper pelvis, measuring 12.8 x 12.3 x 11.5 cm. This may be intraperitoneal or retroperitoneal in nature. A moderate amount of free fluid is noted within the abdomen and pelvis. This is predominantly of relatively low attenuation, though slightly increased attenuation is noted about the liver. This is concerning for either serosanguineous fluid or underlying relatively chronic bleeding into the abdomen and pelvis.  The appendix is not definitely seen; there is no evidence for appendicitis. Scattered diverticulosis is noted along the descending and sigmoid colon, without evidence of diverticulitis. The colon is otherwise unremarkable in appearance.  The bladder is mildly distended and grossly unremarkable in appearance. The patient is status post hysterectomy; no suspicious adnexal masses are seen. No inguinal lymphadenopathy is seen.  No acute osseous abnormalities are identified.  IMPRESSION: 1. Large acute hematoma at the left side of the upper pelvis, measuring 12.8 x 4.3 x 11.5 cm. This may be intraperitoneal or retroperitoneal in nature. Moderate amount of free fluid noted throughout the abdomen and pelvis, of relatively low attenuation, though slightly increased attenuation is seen about the liver.  This may reflect either serosanguineous fluid or underlying relatively chronic extension of blood from the hematoma into the abdomen and pelvis. 2. Scattered diverticulosis along the descending and sigmoid colon, without evidence of diverticulitis. 3. Diffuse calcification along the abdominal aorta and its branches. 4. Trace bilateral pleural effusions, with mild right basilar atelectasis. 5. Diffuse coronary artery calcifications seen. 6. Mild bilateral renal atrophy noted.  Critical Value/emergent results were called by telephone at the time of interpretation on 12/18/2013 at 2:55 AM to Dr. Rolland Porter , who verbally acknowledged these results.   Electronically Signed   By: Roanna Raider M.D.   On: 12/18/2013 03:12   Ct Head Wo Contrast  12/18/2013   CLINICAL DATA:  Headache and vertigo; recent fall. Nausea and vomiting.  EXAM: CT HEAD WITHOUT CONTRAST  TECHNIQUE: Contiguous axial images were obtained from the base of the skull through the vertex without intravenous contrast.  COMPARISON:  CT of the head performed 09/29/2012  FINDINGS: There is no evidence of acute infarction, mass lesion, or intra- or extra-axial hemorrhage on CT. Evaluation is mildly suboptimal due to motion artifact.  Scattered periventricular and subcortical white matter change likely reflects small vessel ischemic microangiopathy.  The posterior fossa, including the cerebellum, brainstem and fourth ventricle, is within normal limits. The third and lateral ventricles, and basal ganglia are unremarkable in appearance. The cerebral hemispheres are symmetric in appearance, with normal gray-white differentiation. No mass effect or midline shift is seen.  There is no evidence of fracture; visualized osseous structures are unremarkable in appearance. The orbits are within normal limits. The paranasal sinuses and mastoid air cells are  well-aerated. No significant soft tissue abnormalities are seen.  IMPRESSION: 1. No evidence of traumatic  intracranial injury or fracture. 2. Scattered small vessel ischemic microangiopathy.   Electronically Signed   By: Roanna Raider M.D.   On: 12/18/2013 00:23    Review of Systems  Unable to perform ROS: dementia   Blood pressure 95/53, pulse 109, temperature 98.6 F (37 C), temperature source Oral, resp. rate 29, SpO2 100.00%. Physical Exam  Vitals reviewed. Constitutional: She appears well-developed and well-nourished. She is cooperative. No distress. Nasal cannula in place.  HENT:  Head: Normocephalic and atraumatic. Head is without raccoon's eyes, without Battle's sign, without abrasion, without contusion and without laceration.  Right Ear: Hearing, external ear and ear canal normal. No lacerations. No drainage or tenderness. No foreign bodies. Tympanic membrane is not perforated. No hemotympanum.  Left Ear: Hearing, external ear and ear canal normal. No lacerations. No drainage or tenderness. No foreign bodies. Tympanic membrane is not perforated. No hemotympanum.  Ears:  Nose: Nose normal. No nose lacerations, sinus tenderness, nasal deformity or nasal septal hematoma. No epistaxis.  Mouth/Throat: Uvula is midline, oropharynx is clear and moist and mucous membranes are normal. No lacerations.  Eyes: Conjunctivae, EOM and lids are normal. Pupils are equal, round, and reactive to light. Right eye exhibits no discharge. Left eye exhibits no discharge. No scleral icterus.  Neck: Trachea normal and normal range of motion. Neck supple. No JVD present. No spinous process tenderness and no muscular tenderness present. Carotid bruit is not present. No tracheal deviation present. No thyromegaly present.  Cardiovascular: Normal rate, regular rhythm, normal heart sounds and intact distal pulses.  Exam reveals decreased pulses. Exam reveals no gallop and no friction rub.   No murmur heard. Respiratory: Effort normal and breath sounds normal. No stridor. No respiratory distress. She has no wheezes. She  has no rales. She exhibits no tenderness, no bony tenderness, no laceration and no crepitus.  GI: Soft. Normal appearance and bowel sounds are normal. She exhibits no distension. There is no tenderness. There is no rigidity, no rebound, no guarding and no CVA tenderness.  Genitourinary: Vagina normal.  Musculoskeletal: Normal range of motion. She exhibits no edema and no tenderness.       Feet:  Lymphadenopathy:    She has no cervical adenopathy.  Neurological: She is alert. She has normal strength. No cranial nerve deficit or sensory deficit. GCS eye subscore is 4. GCS verbal subscore is 5. GCS motor subscore is 6.  Skin: Skin is warm, dry and intact. She is not diaphoretic.  Psychiatric: She has a normal mood and affect. Her speech is normal and behavior is normal.    Assessment/Plan: Abdominopelvic hematoma -- Given her normal lactate and severe anemia coupled with the history provided by her son it would seem this is likely a spontaneous hemorrhage stemming from her Plavix use. The low attenuation fluid seen in the rest of the abdomen is likely reactive. We recommend monitoring with serial hemoglobins and bed rest initially until her hemoglobin stabilizes. Do not restart Plavix and no pharmacologic DVT prophylaxis initially. We will follow along with the primary team. It is ok to give diet. ABL anemia -- CBC q8h AKI -- per primary team Hyponatremia -- per primary team Multiple medical problems -- per primary team    Freeman Caldron, PA-C Pager: [phone] General Trauma [facility] Pager: [phone] 12/18/2013, 7:42 AM

## 2013-12-18 NOTE — ED Notes (Signed)
Orders given to run blood wide open per dr. Fayrene Fearing. Also to run the second blood in wide open as well.

## 2013-12-18 NOTE — ED Notes (Signed)
Flow contacted regarding bed requests

## 2013-12-18 NOTE — Progress Notes (Signed)
pts HR 140-150s- sustained, pt lying in bed, states she feels fine, although pt breathing 26 per minute, crackles heard in lower bases, 98% on RA; BP 118/71,. Spoke with pts son and son stated he thought her abdomen was slightly larger than before, palpated pts abd, non-tender and soft; paged on-call MD and made aware, EKG obtained showed ST 140s

## 2013-12-18 NOTE — ED Notes (Signed)
Pt arrived from [facility] via [facility], awake and alert, pale, dementia and have placed sitter with patient.  Pt has received 2 units PRBCs, NS 500, and SBP 108 for [facility] en route.

## 2013-12-18 NOTE — Progress Notes (Signed)
Internal Medicine Attending Admission Note Date: 12/18/2013  Patient name: [name] Medical record number: [id number] Date of birth: 1933/11/19 Age: 79 y.o. Gender: female  I saw and evaluated the patient and discussed her care with house staff. I reviewed the resident's note and I agree with the resident's findings and plan as documented in the resident's note.  Chief Complaint(s): Fall  History - key components related to admission: Patient is a 79 year old woman with history of dementia, COPD, dyslipidemia, hypertension, coronary artery disease, and other problems as outlined in the medical history who presented to [facility] following a recent fall, was found to be anemic and hypotensive; abdominal CT scan there showed a large acute hematoma at the left side of the upper pelvis, and the patient was transferred to [facility] for further management.  She is currently alert and appears calm although she is disoriented.  She has no acute complaint, and denies chest pain or abdominal pain.   Physical Exam - key components related to admission:  Filed Vitals:   12/18/13 0945 12/18/13 1000 12/18/13 1100 12/18/13 1300  BP: 117/59 102/52 124/61   Pulse:   81   Temp:   98.4 F (36.9 C)   TempSrc:      Resp: 27 25 20    Height:    5\' 3"  (1.6 m)  Weight:    130 lb 6.4 oz (59.149 kg)  SpO2:   98%     General: Alert, disoriented, calm, no apparent distress Lungs: Clear Heart: Regular; no extra sounds or murmurs Abdomen: Bowel sounds present, soft, nontender, mildly distended; no guarding or rebound Extremities: No edema  Lab results:   Basic Metabolic Panel:  Recent Labs  40/98/11 2316 12/18/13 1103  NA 129*  --   K 4.1  --   CL 89*  --   CO2 30  --   GLUCOSE 206*  --   BUN 31*  --   CREATININE 2.50*  --   CALCIUM 9.7  --   MG  --  1.9  PHOS  --  2.6    Liver Function Tests:  Recent Labs  12/05/2013 2316  AST 12  ALT 5  ALKPHOS 37*  BILITOT 0.4  PROT  5.9*  ALBUMIN 2.9*     CBC:  Recent Labs  12/18/13 0320 12/18/13 1103  WBC 12.3* 13.6*  HGB 9.1* 10.7*  HCT 27.1* 33.0*  MCV 84.2 84.0  PLT 210 239      Recent Labs  12/20/2013 2258 12/18/13 0320  NEUTROABS 7.9* 8.7*  LYMPHSABS 1.3 1.7  MONOABS 1.4* 1.9*  EOSABS 0.0 0.1  BASOSABS 0.0 0.0     Hemoglobin  Date Value Range Status  12/18/2013 10.7* 12.0 - 15.0 g/dL Final  91/47/8295 9.1* 12.0 - 15.0 g/dL Final     DELTA CHECK NOTED     POST TRANSFUSION SPECIMEN  12/18/2013 7.2* 12.0 - 15.0 g/dL Final     DELTA CHECK NOTED     POST TRANSFUSION SPECIMEN  11/30/2013 5.7* 12.0 - 15.0 g/dL Final     RESULT REPEATED AND VERIFIED     CRITICAL RESULT CALLED TO, READ BACK BY AND VERIFIED WITH:     BELTON K AT 2325 ON 12/05/2013 BY FORSYTH K  07/06/2013 11.7* 12.0 - 15.0 g/dL Final  06/15/3085 57.8* 12.0 - 15.0 g/dL Final    Cardiac Enzymes:  Recent Labs  12/16/2013 2316 12/18/13 1103  TROPONINI <0.30 <0.30     CBG:  Recent Labs  12/18/13 1126  GLUCAP 116*     Coagulation:  Recent Labs  01-05-2014 2316  INR 1.25     Urinalysis    Component Value Date/Time   COLORURINE YELLOW 12/18/2013 0003   APPEARANCEUR HAZY* 12/18/2013 0003   LABSPEC 1.025 12/18/2013 0003   PHURINE 5.0 12/18/2013 0003   GLUCOSEU NEGATIVE 12/18/2013 0003   HGBUR NEGATIVE 12/18/2013 0003   BILIRUBINUR SMALL* 12/18/2013 0003   KETONESUR NEGATIVE 12/18/2013 0003   PROTEINUR NEGATIVE 12/18/2013 0003   UROBILINOGEN 0.2 12/18/2013 0003   NITRITE NEGATIVE 12/18/2013 0003   LEUKOCYTESUR NEGATIVE 12/18/2013 0003     Imaging results:  Ct Abdomen Pelvis Wo Contrast  12/18/2013   CLINICAL DATA:  Abdominal pain and hematuria.  Anemia.  EXAM: CT ABDOMEN AND PELVIS WITHOUT CONTRAST  TECHNIQUE: Multidetector CT imaging of the abdomen and pelvis was performed following the standard protocol without intravenous contrast.  COMPARISON:  Lumbar spine radiographs performed 09/24/2012  FINDINGS: Trace  bilateral pleural effusions are noted, with mild right basilar atelectasis. Diffuse coronary artery calcifications are seen.  The liver and spleen are unremarkable in appearance. The gallbladder is within normal limits. The pancreas and adrenal glands are unremarkable.  There is mild bilateral renal atrophy. The kidneys are otherwise unremarkable in appearance. There is no evidence of hydronephrosis. No renal or ureteral stones are seen. No perinephric stranding is appreciated.  No free fluid is identified. The small bowel is unremarkable in appearance. The stomach is within normal limits. No acute vascular abnormalities are seen. Diffuse calcification is noted along the abdominal aorta and its branches.  A large acute hematoma is noted at the left side of the upper pelvis, measuring 12.8 x 12.3 x 11.5 cm. This may be intraperitoneal or retroperitoneal in nature. A moderate amount of free fluid is noted within the abdomen and pelvis. This is predominantly of relatively low attenuation, though slightly increased attenuation is noted about the liver. This is concerning for either serosanguineous fluid or underlying relatively chronic bleeding into the abdomen and pelvis.  The appendix is not definitely seen; there is no evidence for appendicitis. Scattered diverticulosis is noted along the descending and sigmoid colon, without evidence of diverticulitis. The colon is otherwise unremarkable in appearance.  The bladder is mildly distended and grossly unremarkable in appearance. The patient is status post hysterectomy; no suspicious adnexal masses are seen. No inguinal lymphadenopathy is seen.  No acute osseous abnormalities are identified.  IMPRESSION: 1. Large acute hematoma at the left side of the upper pelvis, measuring 12.8 x 4.3 x 11.5 cm. This may be intraperitoneal or retroperitoneal in nature. Moderate amount of free fluid noted throughout the abdomen and pelvis, of relatively low attenuation, though slightly  increased attenuation is seen about the liver. This may reflect either serosanguineous fluid or underlying relatively chronic extension of blood from the hematoma into the abdomen and pelvis. 2. Scattered diverticulosis along the descending and sigmoid colon, without evidence of diverticulitis. 3. Diffuse calcification along the abdominal aorta and its branches. 4. Trace bilateral pleural effusions, with mild right basilar atelectasis. 5. Diffuse coronary artery calcifications seen. 6. Mild bilateral renal atrophy noted.  Critical Value/emergent results were called by telephone at the time of interpretation on 12/18/2013 at 2:55 AM to Dr. Rolland Porter , who verbally acknowledged these results.   Electronically Signed   By: Roanna Raider M.D.   On: 12/18/2013 03:12   Ct Head Wo Contrast  12/18/2013   CLINICAL DATA:  Headache and vertigo; recent fall. Nausea  and vomiting.  EXAM: CT HEAD WITHOUT CONTRAST  TECHNIQUE: Contiguous axial images were obtained from the base of the skull through the vertex without intravenous contrast.  COMPARISON:  CT of the head performed 09/29/2012  FINDINGS: There is no evidence of acute infarction, mass lesion, or intra- or extra-axial hemorrhage on CT. Evaluation is mildly suboptimal due to motion artifact.  Scattered periventricular and subcortical white matter change likely reflects small vessel ischemic microangiopathy.  The posterior fossa, including the cerebellum, brainstem and fourth ventricle, is within normal limits. The third and lateral ventricles, and basal ganglia are unremarkable in appearance. The cerebral hemispheres are symmetric in appearance, with normal gray-white differentiation. No mass effect or midline shift is seen.  There is no evidence of fracture; visualized osseous structures are unremarkable in appearance. The orbits are within normal limits. The paranasal sinuses and mastoid air cells are well-aerated. No significant soft tissue abnormalities are seen.   IMPRESSION: 1. No evidence of traumatic intracranial injury or fracture. 2. Scattered small vessel ischemic microangiopathy.   Electronically Signed   By: Roanna Raider M.D.   On: 12/18/2013 00:23   Portable Chest 1 View  12/18/2013   CLINICAL DATA:  Wheezing  EXAM: PORTABLE CHEST - 1 VIEW  COMPARISON:  07/01/2013  FINDINGS: The cardiac shadow is within normal limits. The lungs are well aerated bilaterally without focal infiltrate or sizable effusion. Persistent mild interstitial changes are identified without focal infiltrate.  IMPRESSION: Chronic changes without acute abnormality.   Electronically Signed   By: Alcide Clever M.D.   On: 12/18/2013 10:42    Other results: EKG: Sinus rhythm with PACs, otherwise normal  Assessment & Plan by Problem:  1.  Large acute hematoma in the upper side of the left pelvis due to retroperitoneal versus intraperitoneal hemorrhage.  The etiology is not clear; this may be a spontaneous hemorrhage.  Patient is hemodynamically stable, and hemoglobin has improved following transfusion.  Trauma surgery has seen patient and is following.  Plans include monitor hemoglobin; bed rest for now; avoid anticoagulants and antiplatelet agents; transfuse as indicated.  2.  Anemia secondary to #1.  As above, hemoglobin has improved following transfusion; patient is hemodynamically stable.  Plan is follow hemoglobin and transfuse as indicated.  3.  Other problems and plans as per the resident physician's note.

## 2013-12-18 NOTE — ED Notes (Signed)
Hospitalists in with pt at this time

## 2013-12-18 NOTE — H&P (Signed)
Internal Medicine Attending Admission Note Date: 12/18/2013  Patient name: [name] Medical record number: [id number] Date of birth: 01/07/1933 Age: 79 y.o. Gender: female  I saw and evaluated the patient and discussed her care with house staff. I reviewed the resident's note and I agree with the resident's findings and plan as documented in the resident's note.  Chief Complaint(s): Fall  History - key components related to admission: Patient is a 79-year-old woman with history of dementia, COPD, dyslipidemia, hypertension, coronary artery disease, and other problems as outlined in the medical history who presented to [facility] following a recent fall, was found to be anemic and hypotensive; abdominal CT scan there showed a large acute hematoma at the left side of the upper pelvis, and the patient was transferred to [facility] for further management.  She is currently alert and appears calm although she is disoriented.  She has no acute complaint, and denies chest pain or abdominal pain.   Physical Exam - key components related to admission:  Filed Vitals:   12/18/13 0945 12/18/13 1000 12/18/13 1100 12/18/13 1300  BP: 117/59 102/52 124/61   Pulse:   81   Temp:   98.4 F (36.9 C)   TempSrc:      Resp: 27 25 20   Height:    5' 3" (1.6 m)  Weight:    130 lb 6.4 oz (59.149 kg)  SpO2:   98%     General: Alert, disoriented, calm, no apparent distress Lungs: Clear Heart: Regular; no extra sounds or murmurs Abdomen: Bowel sounds present, soft, nontender, mildly distended; no guarding or rebound Extremities: No edema  Lab results:   Basic Metabolic Panel:  Recent Labs  12/01/2013 2316 12/18/13 1103  NA 129*  --   K 4.1  --   CL 89*  --   CO2 30  --   GLUCOSE 206*  --   BUN 31*  --   CREATININE 2.50*  --   CALCIUM 9.7  --   MG  --  1.9  PHOS  --  2.6    Liver Function Tests:  Recent Labs  11/29/2013 2316  AST 12  ALT 5  ALKPHOS 37*  BILITOT 0.4  PROT  5.9*  ALBUMIN 2.9*     CBC:  Recent Labs  12/18/13 0320 12/18/13 1103  WBC 12.3* 13.6*  HGB 9.1* 10.7*  HCT 27.1* 33.0*  MCV 84.2 84.0  PLT 210 239      Recent Labs  12/04/2013 2258 12/18/13 0320  NEUTROABS 7.9* 8.7*  LYMPHSABS 1.3 1.7  MONOABS 1.4* 1.9*  EOSABS 0.0 0.1  BASOSABS 0.0 0.0     Hemoglobin  Date Value Range Status  12/18/2013 10.7* 12.0 - 15.0 g/dL Final  12/18/2013 9.1* 12.0 - 15.0 g/dL Final     DELTA CHECK NOTED     POST TRANSFUSION SPECIMEN  12/18/2013 7.2* 12.0 - 15.0 g/dL Final     DELTA CHECK NOTED     POST TRANSFUSION SPECIMEN  12/12/2013 5.7* 12.0 - 15.0 g/dL Final     RESULT REPEATED AND VERIFIED     CRITICAL RESULT CALLED TO, READ BACK BY AND VERIFIED WITH:     BELTON K AT 2325 ON 12/02/2013 BY FORSYTH K  07/06/2013 11.7* 12.0 - 15.0 g/dL Final  07/05/2013 11.5* 12.0 - 15.0 g/dL Final    Cardiac Enzymes:  Recent Labs  11/30/2013 2316 12/18/13 1103  TROPONINI <0.30 <0.30     CBG:  Recent Labs    12/18/13 1126  GLUCAP 116*     Coagulation:  Recent Labs  12/19/2013 2316  INR 1.25     Urinalysis    Component Value Date/Time   COLORURINE YELLOW 12/18/2013 0003   APPEARANCEUR HAZY* 12/18/2013 0003   LABSPEC 1.025 12/18/2013 0003   PHURINE 5.0 12/18/2013 0003   GLUCOSEU NEGATIVE 12/18/2013 0003   HGBUR NEGATIVE 12/18/2013 0003   BILIRUBINUR SMALL* 12/18/2013 0003   KETONESUR NEGATIVE 12/18/2013 0003   PROTEINUR NEGATIVE 12/18/2013 0003   UROBILINOGEN 0.2 12/18/2013 0003   NITRITE NEGATIVE 12/18/2013 0003   LEUKOCYTESUR NEGATIVE 12/18/2013 0003     Imaging results:  Ct Abdomen Pelvis Wo Contrast  12/18/2013   CLINICAL DATA:  Abdominal pain and hematuria.  Anemia.  EXAM: CT ABDOMEN AND PELVIS WITHOUT CONTRAST  TECHNIQUE: Multidetector CT imaging of the abdomen and pelvis was performed following the standard protocol without intravenous contrast.  COMPARISON:  Lumbar spine radiographs performed 09/24/2012  FINDINGS: Trace  bilateral pleural effusions are noted, with mild right basilar atelectasis. Diffuse coronary artery calcifications are seen.  The liver and spleen are unremarkable in appearance. The gallbladder is within normal limits. The pancreas and adrenal glands are unremarkable.  There is mild bilateral renal atrophy. The kidneys are otherwise unremarkable in appearance. There is no evidence of hydronephrosis. No renal or ureteral stones are seen. No perinephric stranding is appreciated.  No free fluid is identified. The small bowel is unremarkable in appearance. The stomach is within normal limits. No acute vascular abnormalities are seen. Diffuse calcification is noted along the abdominal aorta and its branches.  A large acute hematoma is noted at the left side of the upper pelvis, measuring 12.8 x 12.3 x 11.5 cm. This may be intraperitoneal or retroperitoneal in nature. A moderate amount of free fluid is noted within the abdomen and pelvis. This is predominantly of relatively low attenuation, though slightly increased attenuation is noted about the liver. This is concerning for either serosanguineous fluid or underlying relatively chronic bleeding into the abdomen and pelvis.  The appendix is not definitely seen; there is no evidence for appendicitis. Scattered diverticulosis is noted along the descending and sigmoid colon, without evidence of diverticulitis. The colon is otherwise unremarkable in appearance.  The bladder is mildly distended and grossly unremarkable in appearance. The patient is status post hysterectomy; no suspicious adnexal masses are seen. No inguinal lymphadenopathy is seen.  No acute osseous abnormalities are identified.  IMPRESSION: 1. Large acute hematoma at the left side of the upper pelvis, measuring 12.8 x 4.3 x 11.5 cm. This may be intraperitoneal or retroperitoneal in nature. Moderate amount of free fluid noted throughout the abdomen and pelvis, of relatively low attenuation, though slightly  increased attenuation is seen about the liver. This may reflect either serosanguineous fluid or underlying relatively chronic extension of blood from the hematoma into the abdomen and pelvis. 2. Scattered diverticulosis along the descending and sigmoid colon, without evidence of diverticulitis. 3. Diffuse calcification along the abdominal aorta and its branches. 4. Trace bilateral pleural effusions, with mild right basilar atelectasis. 5. Diffuse coronary artery calcifications seen. 6. Mild bilateral renal atrophy noted.  Critical Value/emergent results were called by telephone at the time of interpretation on 12/18/2013 at 2:55 AM to Dr. MARK JAMES , who verbally acknowledged these results.   Electronically Signed   By: Jeffery  Chang M.D.   On: 12/18/2013 03:12   Ct Head Wo Contrast  12/18/2013   CLINICAL DATA:  Headache and vertigo; recent fall. Nausea   and vomiting.  EXAM: CT HEAD WITHOUT CONTRAST  TECHNIQUE: Contiguous axial images were obtained from the base of the skull through the vertex without intravenous contrast.  COMPARISON:  CT of the head performed 09/29/2012  FINDINGS: There is no evidence of acute infarction, mass lesion, or intra- or extra-axial hemorrhage on CT. Evaluation is mildly suboptimal due to motion artifact.  Scattered periventricular and subcortical white matter change likely reflects small vessel ischemic microangiopathy.  The posterior fossa, including the cerebellum, brainstem and fourth ventricle, is within normal limits. The third and lateral ventricles, and basal ganglia are unremarkable in appearance. The cerebral hemispheres are symmetric in appearance, with normal gray-white differentiation. No mass effect or midline shift is seen.  There is no evidence of fracture; visualized osseous structures are unremarkable in appearance. The orbits are within normal limits. The paranasal sinuses and mastoid air cells are well-aerated. No significant soft tissue abnormalities are seen.   IMPRESSION: 1. No evidence of traumatic intracranial injury or fracture. 2. Scattered small vessel ischemic microangiopathy.   Electronically Signed   By: Jeffery  Chang M.D.   On: 12/18/2013 00:23   Portable Chest 1 View  12/18/2013   CLINICAL DATA:  Wheezing  EXAM: PORTABLE CHEST - 1 VIEW  COMPARISON:  07/01/2013  FINDINGS: The cardiac shadow is within normal limits. The lungs are well aerated bilaterally without focal infiltrate or sizable effusion. Persistent mild interstitial changes are identified without focal infiltrate.  IMPRESSION: Chronic changes without acute abnormality.   Electronically Signed   By: Mark  Lukens M.D.   On: 12/18/2013 10:42    Other results: EKG: Sinus rhythm with PACs, otherwise normal  Assessment & Plan by Problem:  1.  Large acute hematoma in the upper side of the left pelvis due to retroperitoneal versus intraperitoneal hemorrhage.  The etiology is not clear; this may be a spontaneous hemorrhage.  Patient is hemodynamically stable, and hemoglobin has improved following transfusion.  Trauma surgery has seen patient and is following.  Plans include monitor hemoglobin; bed rest for now; avoid anticoagulants and antiplatelet agents; transfuse as indicated.  2.  Anemia secondary to #1.  As above, hemoglobin has improved following transfusion; patient is hemodynamically stable.  Plan is follow hemoglobin and transfuse as indicated.  3.  Other problems and plans as per the resident physician's note.     

## 2013-12-18 NOTE — Progress Notes (Signed)
Unit CM UR Completed by [facility] ED CM  W. Shyleigh Daughtry RN  

## 2013-12-18 NOTE — ED Notes (Signed)
Pt resting family at bedside.

## 2013-12-18 NOTE — H&P (Signed)
Date: 12/18/2013               Patient Name:  [name] MRN: [id number]  DOB: October 28, 1933 Age / Sex: 79 y.o., female   PCP: Richardean Chimera, MD         Medical Service: Internal Medicine Teaching Service         Attending Physician: Dr. Robyne Askew, MD    First Contact: Dr. Mariea Clonts Pager: [phone]  Second Contact: Dr. Zada Girt Pager: [phone]       After Hours (After 5p/  First Contact Pager: [phone]  weekends / holidays): Second Contact Pager: [phone]   Chief Complaint: Falls  History of Present Illness: 24 Y O F with PMH of Dementia, COPD, Dyslipidemia, HTN, CAD-NSTEMI s/p  Most of the hx was gotten from the daughter who was present at the pt bedside. Was transferred form [facility] hospital, but initially presented from the nursing home with c/o of a fall that occured 2 nights ago- specifically on Monday night, fall was not witnessed. Pt was in the bathrom when she fell on her Left knee at around 7pm that evening. Patient then fell again later that day about 1 hr later when she tried to get up form the bed to go to the bathroom, and this time she hit the Left side of her fore-head and sustained a bruise. Patinet has hardly fallen in the past. Not known if pt initally passed out or was dizzy. Patient vomited 3 times at [facility] yesterday night, consisted of recently ingested food. Also yesterday evening pt started complaining of upper abdominal pain,  And nausea. Daughter also says pts appetite has been poor. No diarrhea, no fever, no cough. Patient has intermittent SOB, uses O2 at night and sometimes during the day. Pt is able to communicate what she wants most times, carry on meaning full conversations, but has poor memory, calls her daughter her twin, can feed herself and walk around, but needs help with bathing and clothing.   After pt fell, HGB was done which came back the next morning at 7.3 ,and repeat that later day was 7.2. FOBT done was negative. Chest xray- Port  was also done at the nursing home, which daughter said was negative for PNA, also pt was started on antibiotics- Levaquin, for presumed infection.   Meds: No current facility-administered medications for this encounter.   Current Outpatient Prescriptions  Medication Sig Dispense Refill  . acetaminophen (TYLENOL) 325 MG tablet Take 2 tablets (650 mg total) by mouth every 4 (four) hours as needed.      Marland Kitchen albuterol (PROVENTIL HFA;VENTOLIN HFA) 108 (90 BASE) MCG/ACT inhaler Inhale 2 puffs into the lungs every 6 (six) hours as needed for wheezing.      Marland Kitchen aspirin 81 MG chewable tablet Chew 81 mg by mouth daily.      . bisacodyl (DULCOLAX) 10 MG suppository Place 1 suppository (10 mg total) rectally as needed.  12 suppository  0  . citalopram (CELEXA) 20 MG tablet Take 20 mg by mouth daily.        . clopidogrel (PLAVIX) 75 MG tablet Take 75 mg by mouth daily.        . divalproex (DEPAKOTE ER) 250 MG 24 hr tablet Take 250 mg by mouth 2 (two) times daily.       . furosemide (LASIX) 20 MG tablet Take 20 mg by mouth daily.      Marland Kitchen glipiZIDE (GLUCOTROL) 10 MG tablet  Take 10 mg by mouth daily.        . Lactobacillus (ACIDOPHILUS PO) Take 1 capsule by mouth 2 (two) times daily. 2 week course starting on 12/16/13      . levofloxacin (LEVAQUIN) 500 MG tablet Take 500 mg by mouth daily. 10 day course starting on 12/07/2013      . lisinopril (PRINIVIL,ZESTRIL) 20 MG tablet Take 20 mg by mouth daily.      . Melatonin 3 MG TABS Take 3 mg by mouth at bedtime.      . nitroGLYCERIN (NITROSTAT) 0.4 MG SL tablet Place 0.4 mg under the tongue every 5 (five) minutes as needed.          Allergies: Allergies as of 12/23/2013 - Review Complete 12/09/2013  Allergen Reaction Noted  . Atorvastatin    . Other  12/24/2013  . Penicillins     Past Medical History  Diagnosis Date  . Coronary artery disease     multivessel  . Hypotension   . Peripheral artery disease   . Hypertension     hx  . Diabetes mellitus, type  2   . Dyslipidemia   . Tobacco user     remote  . Stroke   . COPD (chronic obstructive pulmonary disease)   . Dementia    History reviewed. No pertinent past surgical history. History reviewed. No pertinent family history. History   Social History  . Marital Status: Widowed    Spouse Name: N/A    Number of Children: N/A  . Years of Education: N/A   Occupational History  . Not on file.   Social History Main Topics  . Smoking status: Never Smoker   . Smokeless tobacco: Not on file  . Alcohol Use: No  . Drug Use: No  . Sexual Activity: Not on file   Other Topics Concern  . Not on file   Social History Narrative  . No narrative on file    Review of Systems: SKIIN- , hx of melanoma excision on Ant Rt leg. HEAD- No Headache, or dizziness. CARDIAC- No chest pain. GI-  Vomiting and abdominal pain present, no diarrhoea, or onstipation URINARY- Pt can not tel [place] if she has polyuria or dysuria NEUROLOGIC- No Numbness, syncope,or  Burning.  Physical Exam: Blood pressure 102/53, pulse 106, temperature 98.6 F (37 C), temperature source Oral, resp. rate 20, SpO2 100.00%. GENERAL- Sometimes appears confused, but could answer some questions, appears as stated age, not in any distress. HEENT- Light bluish bruise on Left side of forehead, normocephalic, PERRL, EOMI, oral mucosa appears moist, no cervical LN enlargement, thyroid does not appear enlarged. CARDIAC- RRR, no murmurs, rubs or gallops. RESP- Moving equal volumes of air, diffuse expiratory wheezes bilaterally. ABDOMEN- Soft, nontender, no palpable masses or organomegaly, bowel sounds present. BACK- Normal curvature of the spine, No tenderness along the vertebrae, no CVA tenderness. NEURO- Oriented to person and place but not time, No obvious Cr N abnormality, strenght- 4/5 in all extremities. EXTREMITIES- pulse 2+, symmetric, no pedal edema. SKIN- Warm, dry, Skin tags on pts back. PSYCH- Normal mood and affect,  appropriate thought content and speech.   Lab results: Basic Metabolic Panel:  Recent Labs  16/10/96 2316  NA 129*  K 4.1  CL 89*  CO2 30  GLUCOSE 206*  BUN 31*  CREATININE 2.50*  CALCIUM 9.7   Liver Function Tests:  Recent Labs  12/24/2013 2316  AST 12  ALT 5  ALKPHOS 37*  BILITOT 0.4  PROT  5.9*  ALBUMIN 2.9*   No results found for this basename: LIPASE, AMYLASE,  in the last 72 hours No results found for this basename: AMMONIA,  in the last 72 hours CBC:  Recent Labs  11/25/2013 2258 12/18/13 0138 12/18/13 0320  WBC 10.6* 12.1* 12.3*  NEUTROABS 7.9*  --  8.7*  HGB 5.7* 7.2* 9.1*  HCT 17.7* 21.8* 27.1*  MCV 80.5 82.9 84.2  PLT 236 206 210   Cardiac Enzymes:  Recent Labs  12/01/2013 2316  TROPONINI <0.30   Coagulation:  Recent Labs  12/03/2013 2316  LABPROT 15.4*  INR 1.25   Urinalysis:  Recent Labs  12/18/13 0003  COLORURINE YELLOW  LABSPEC 1.025  PHURINE 5.0  GLUCOSEU NEGATIVE  HGBUR NEGATIVE  BILIRUBINUR SMALL*  KETONESUR NEGATIVE  PROTEINUR NEGATIVE  UROBILINOGEN 0.2  NITRITE NEGATIVE  LEUKOCYTESUR NEGATIVE    Imaging results:  Ct Abdomen Pelvis Wo Contrast  12/18/2013   CLINICAL DATA:  Abdominal pain and hematuria.  Anemia.  EXAM: CT ABDOMEN AND PELVIS WITHOUT CONTRAST  TECHNIQUE: Multidetector CT imaging of the abdomen and pelvis was performed following the standard protocol without intravenous contrast.  COMPARISON:  Lumbar spine radiographs performed 09/24/2012  FINDINGS: Trace bilateral pleural effusions are noted, with mild right basilar atelectasis. Diffuse coronary artery calcifications are seen.  The liver and spleen are unremarkable in appearance. The gallbladder is within normal limits. The pancreas and adrenal glands are unremarkable.  There is mild bilateral renal atrophy. The kidneys are otherwise unremarkable in appearance. There is no evidence of hydronephrosis. No renal or ureteral stones are seen. No perinephric stranding  is appreciated.  No free fluid is identified. The small bowel is unremarkable in appearance. The stomach is within normal limits. No acute vascular abnormalities are seen. Diffuse calcification is noted along the abdominal aorta and its branches.  A large acute hematoma is noted at the left side of the upper pelvis, measuring 12.8 x 12.3 x 11.5 cm. This may be intraperitoneal or retroperitoneal in nature. A moderate amount of free fluid is noted within the abdomen and pelvis. This is predominantly of relatively low attenuation, though slightly increased attenuation is noted about the liver. This is concerning for either serosanguineous fluid or underlying relatively chronic bleeding into the abdomen and pelvis.  The appendix is not definitely seen; there is no evidence for appendicitis. Scattered diverticulosis is noted along the descending and sigmoid colon, without evidence of diverticulitis. The colon is otherwise unremarkable in appearance.  The bladder is mildly distended and grossly unremarkable in appearance. The patient is status post hysterectomy; no suspicious adnexal masses are seen. No inguinal lymphadenopathy is seen.  No acute osseous abnormalities are identified.  IMPRESSION: 1. Large acute hematoma at the left side of the upper pelvis, measuring 12.8 x 4.3 x 11.5 cm. This may be intraperitoneal or retroperitoneal in nature. Moderate amount of free fluid noted throughout the abdomen and pelvis, of relatively low attenuation, though slightly increased attenuation is seen about the liver. This may reflect either serosanguineous fluid or underlying relatively chronic extension of blood from the hematoma into the abdomen and pelvis. 2. Scattered diverticulosis along the descending and sigmoid colon, without evidence of diverticulitis. 3. Diffuse calcification along the abdominal aorta and its branches. 4. Trace bilateral pleural effusions, with mild right basilar atelectasis. 5. Diffuse coronary artery  calcifications seen. 6. Mild bilateral renal atrophy noted.  Critical Value/emergent results were called by telephone at the time of interpretation on 12/18/2013 at 2:55 AM to Dr.  MARK JAMES , who verbally acknowledged these results.   Electronically Signed   By: Roanna Raider M.D.   On: 12/18/2013 03:12   Ct Head Wo Contrast  12/18/2013   CLINICAL DATA:  Headache and vertigo; recent fall. Nausea and vomiting.  EXAM: CT HEAD WITHOUT CONTRAST  TECHNIQUE: Contiguous axial images were obtained from the base of the skull through the vertex without intravenous contrast.  COMPARISON:  CT of the head performed 09/29/2012  FINDINGS: There is no evidence of acute infarction, mass lesion, or intra- or extra-axial hemorrhage on CT. Evaluation is mildly suboptimal due to motion artifact.  Scattered periventricular and subcortical white matter change likely reflects small vessel ischemic microangiopathy.  The posterior fossa, including the cerebellum, brainstem and fourth ventricle, is within normal limits. The third and lateral ventricles, and basal ganglia are unremarkable in appearance. The cerebral hemispheres are symmetric in appearance, with normal gray-white differentiation. No mass effect or midline shift is seen.  There is no evidence of fracture; visualized osseous structures are unremarkable in appearance. The orbits are within normal limits. The paranasal sinuses and mastoid air cells are well-aerated. No significant soft tissue abnormalities are seen.  IMPRESSION: 1. No evidence of traumatic intracranial injury or fracture. 2. Scattered small vessel ischemic microangiopathy.   Electronically Signed   By: Roanna Raider M.D.   On: 12/18/2013 00:23    Other results: EKG:  Rate- 93 bpm, regular sinus rhythm, normal PR interval , no qrs prolongation, QTC- 450, no Q waves, ST or T wave abnormalities.   Assessment & Plan by Problem: Active Problems:   Hypotension   Acute blood loss anemia   Abdominal  hematoma  Acute blood loss Anemia- Considering Ct findings in the abdomen(- of large acute intraabd bleed-?intra or extra abd hematoma )Hgb drop most likely due to Intrabdominal bleeding- Contributary factors- Falls, and Medications- Aspirin and Clopidogrel. Not exactly sure if the falls provoked the bleeding or if the bleeding occurred spontaneously and pt fell due to hypotension from acute blood loss. Other concerning etiologies for fall and hypotension is Infection and therefore Sepsis, as pt was tachycardic, has a leukocytosis of 13.6 and is tachypneic and therfore meets the SIRs criteria, but pt has been afebrile throughout admission and no hx of fever. Source of infection has not been identified at this time- UA not suggestive, with Negative Nitrites and leukocytes. Chest Xray- Portable- Not suggestive of infection.  Also consider leukocytosis due to stress response Post transfusion HGB- 10.7. Also consider dehydration and therefore hypotension due to poor oral intake. Cardiac etiology as cause of Falls, as pt has a hx of CAD with multiple stents- EKG-  Not concerning for ischemia or MI, Troponins X2 so far negative. Initail HGB done last night in [facility] hospital was 5.7, and pt was therefore transfused, with good response. Labs done at [facility] last night- INR- 1.25,  - Admit to Tele.  - Followed by Surgery/trauma- recommend CBCs Q8H. - Head CT- No evidence of traumatic intracranial injury. - Consider Blood Cultures - Cycle tropnins- X3, @ are negative. - Lactic Acid normal. - Im haldol- 0.5mg  stat, but as patient became confused and was combative. - Sitter at bedside - Fall precautions - IVF n/s Bolus given in the ED, maintain at 30mls/hr. - Hold Aspirin and plavix. - EKG tomorrow morning.  Hyponatremia- Consider dehydration due to poor oral intake as aetiology. - IVF N/s - Repeat BMP  Dyslipidemia- Last checked 5 months ago- LDL- 108, total  Chol- 194, TG- 233. Home med list  do not include any statins.  COPD- appear sstable, but presence of diffuse mild exp wheezes. Home meds- Albuterol inhalers Q6H. - Commence Albuterol inahlers  DM- Home meds- Glipizide 10mg  daily. CBGs- AC, QHS - SSI-   CAD- drug-eluting stent to the proximal LAD. She had late in-stent thrombosis in January 2009. The patient also had multiple drug-eluting stents in right coronary artery as well as circumflex coronary artery. Her last cardiac catheterization was in May of 2010 and demonstrated 50-70% distal circumflex stenosis, patent has LAD stents and 50% distal RCA in-stent restenosis. Ejection fraction is 60%.  Home meds- Aspirin 81 mg daily , clopidogrel 75mg  daily. Hold for now, considering hematoma.  HTN- Home meds- Lisinopril- 20mg  daily, Frusemide- 20mg  daily. All held on admission due to hypotension.  Dementia- Patient is not totally independent, lives at [facility] center at [place]. Nursing staff reported that patinet is confused and pulling out IV lines. Consider Delirium Vs Dementia. - NPO till Bedside swallow test is passed. - Pt was given 0.5mg  of haldol, and the Q8H PRN.  DVT PPX- SCDS  CODE STATUS- DNR, Daughter- at bedside who is the [facility], who confirmed pt is DNR.   Dispo: Disposition is deferred at this time, awaiting improvement of current medical problems. Anticipated discharge in approximately 1-2 day(s).   The patient does have a current PCP Richardean Chimera, MD) and does need an [facility] hospital follow-up appointment after discharge.  The patient does not know have transportation limitations that hinder transportation to clinic appointments.  Signed: Kennis Carina, MD 12/18/2013, 9:12 AM

## 2013-12-18 NOTE — Progress Notes (Signed)
INTERNAL MEDICINE TEACHING SERVICE Interim note   Subjective:    We were called by the RN for evaluation of tachycardia and increased abdominal swelling. At time of evaluation, pt denied any pain but she appeared more dyspneic compared to her a.m exam by me during her admission.    Objective:    BP 116/79  Pulse 107  Temp(Src) 98.6 F (37 C) (Oral)  Resp 20  Ht 5\' 3"  (1.6 m)  Wt 130 lb 6.4 oz (59.149 kg)  BMI 23.11 kg/m2  SpO2 100%   Labs: Basic Metabolic Panel:    Component Value Date/Time   NA 129* 12/02/2013 2316   K 4.1 12/20/2013 2316   CL 89* 12/23/2013 2316   CO2 30 12/02/2013 2316   BUN 31* 12/20/2013 2316   CREATININE 2.50* 12/08/2013 2316   GLUCOSE 206* 12/06/2013 2316   CALCIUM 9.7 12/19/2013 2316    CBC:    Component Value Date/Time   WBC 12.7* 12/18/2013 1701   HGB 9.7* 12/18/2013 1701   HCT 29.9* 12/18/2013 1701   PLT 238 12/18/2013 1701   MCV 84.7 12/18/2013 1701   NEUTROABS 8.7* 12/18/2013 0320   LYMPHSABS 1.7 12/18/2013 0320   MONOABS 1.9* 12/18/2013 0320   EOSABS 0.1 12/18/2013 0320   BASOSABS 0.0 12/18/2013 0320    Cardiac Enzymes: Lab Results  Component Value Date   CKTOTAL 856* 12/19/2008   CKMB 45.5* 12/19/2008   TROPONINI <0.30 12/18/2013    Physical Exam: General: Vital signs reviewed and noted. Alert and answers questions. Denies pain. Seater ar bedside. She looks calm otherwise  Lungs:  Normal respiratory effort. Clear to auscultation BL without crackles or wheezes.  Heart: RRR. S1 and S2 normal without gallop, murmur, or rubs.  Abdomen:  Slightly more distended compare to a.m exam today. Nontender to palpation but impressive rebound tenderness.  No masses or organomegaly.  Extremities: No pretibial edema.     Assessment/ Plan:   Patient with acute tachycardia, likely related to expanding intra-abdominal hematoma with more bleeding. Repeat EKG, noted to show a short PR interval of 90 ms compared to her previous EKG on the  night of 11/26/2013 which might point to arrhythmias as contributing to her tachycardia. Stat Portable x-ray without pulmonary infiltrates. Last hemoglobin level at 1700hrs 9.7 < 10.7. I discussed with Dr. Sung Amabile of critical care about the patient.  Plan  1. IV metoprolol 2.5 mg several times to bring her heart rate lower than 120 2. IV normal saline 500 cc bolus, since pcxr is clear. 3. Repeat coags, BMP, and another set of CBC 4. Maintain Hbg >9 5. Transfer to the step down unit 6. Attempted to contact her daughter Aurea Graff), she did not pick the phone. 7. Trauma service contacted, who indicated a possibility of pelvic angiogram by IR as patient is high surgical risk  Signed:  Dow Adolph, MD PGY-2 Internal Medicine Teaching Service Pager: [phone] 12/18/2013, 8:36 PM

## 2013-12-18 NOTE — Progress Notes (Signed)
Report called to 2 [facility].  Pt given 2.5 mg IV metoprolol per orders, HR currently 106 BP 116/79 O2 sat 100% on 2L/[place].  I called pts daughter and informed her of Pts transfer and room number. Will continue to monitor. Dierdre Highman, RN

## 2013-12-18 NOTE — Consult Note (Signed)
Spontaneous large RP hemorrhage that has leaked some serous fluid intraperitoneally. Bedrest, no antiplatelet therapy. No heparin or Lovenox for now. Medicine admission to manage AKI and Na. I spoke with her son. Patient examined and I agree with the assessment and plan  Violeta Gelinas, MD, MPH, FACS Pager: [phone]  12/18/2013 11:11 AM

## 2013-12-18 NOTE — Progress Notes (Addendum)
Notified IM MD that pt had arrived to the unit.

## 2013-12-19 ENCOUNTER — Inpatient Hospital Stay (HOSPITAL_COMMUNITY): Payer: Medicare Other

## 2013-12-19 DIAGNOSIS — R58 Hemorrhage, not elsewhere classified: Secondary | ICD-10-CM

## 2013-12-19 DIAGNOSIS — D62 Acute posthemorrhagic anemia: Secondary | ICD-10-CM

## 2013-12-19 LAB — URINE CULTURE

## 2013-12-19 LAB — COMPREHENSIVE METABOLIC PANEL
AST: 15 U/L (ref 0–37)
Albumin: 2.9 g/dL — ABNORMAL LOW (ref 3.5–5.2)
BUN: 15 mg/dL (ref 6–23)
Calcium: 9.4 mg/dL (ref 8.4–10.5)
Chloride: 100 mEq/L (ref 96–112)
GFR calc Af Amer: >90 mL/min (ref >90)
Glucose, Bld: 190 mg/dL — ABNORMAL HIGH (ref 70–99)
Total Protein: 6.4 g/dL (ref 6.0–8.3)

## 2013-12-19 LAB — TYPE AND SCREEN
ABO/RH(D): O NEG
Antibody Screen: NEGATIVE
Unit division: 0

## 2013-12-19 LAB — CBC
HCT: 31.4 % — ABNORMAL LOW (ref 36.0–46.0)
HCT: 33.2 % — ABNORMAL LOW (ref 36.0–46.0)
Hemoglobin: 10.4 g/dL — ABNORMAL LOW (ref 12.0–15.0)
Hemoglobin: 9.6 g/dL — ABNORMAL LOW (ref 12.0–15.0)
MCH: 27.7 pg (ref 26.0–34.0)
MCHC: 32.2 g/dL (ref 30.0–36.0)
MCHC: 31.3 g/dL (ref 30.0–36.0)
MCHC: 31.4 g/dL (ref 30.0–36.0)
MCV: 86.3 fL (ref 78.0–100.0)
Platelets: 259 10*3/uL (ref 150–400)
Platelets: 279 10*3/uL (ref 150–400)
Platelets: 268 10*3/uL (ref 150–400)
RBC: 3.49 MIL/uL — ABNORMAL LOW (ref 3.87–5.11)
RDW: 15 % (ref 11.5–15.5)
WBC: 12.8 10*3/uL — ABNORMAL HIGH (ref 4.0–10.5)
WBC: 14.4 10*3/uL — ABNORMAL HIGH (ref 4.0–10.5)

## 2013-12-19 LAB — MRSA PCR SCREENING: MRSA by PCR: NEGATIVE

## 2013-12-19 MED ORDER — METOPROLOL TARTRATE 1 MG/ML IV SOLN: 2.5000 mg | INTRAVENOUS | Status: DC | PRN

## 2013-12-19 MED ORDER — WHITE PETROLATUM GEL
Status: AC
  Administered 2013-12-19: 14:00:00
  Filled 2013-12-19: qty 5

## 2013-12-19 MED ORDER — IOHEXOL 350 MG/ML SOLN
100.0000 mL | Freq: Once | INTRAVENOUS | Status: AC | PRN
  Administered 2013-12-19: 100 mL via INTRAVENOUS

## 2013-12-19 MED ORDER — VALPROATE SODIUM 500 MG/5ML IV SOLN
250.0000 mg | Freq: Two times a day (BID) | INTRAVENOUS | Status: DC
  Administered 2013-12-19 – 2013-12-21 (×4): 250 mg via INTRAVENOUS
  Filled 2013-12-19 (×5): qty 2.5

## 2013-12-19 MED ORDER — MORPHINE SULFATE 2 MG/ML IJ SOLN
2.0000 mg | Freq: Once | INTRAMUSCULAR | Status: AC
  Administered 2013-12-19: 2 mg via INTRAVENOUS
  Filled 2013-12-19: qty 1

## 2013-12-19 MED ORDER — SODIUM CHLORIDE 0.9 % IV SOLN
INTRAVENOUS | Status: DC
  Administered 2013-12-19 – 2013-12-20 (×3): via INTRAVENOUS

## 2013-12-19 NOTE — Progress Notes (Addendum)
Internal Medicine Attending  Date: 12/19/2013  Patient name: [name] Medical record number: [id number] Date of birth: 1933-11-10 Age: 79 y.o. Gender: female  I saw and evaluated the patient and discussed her care with the night float resident and intern.  Patient was less responsive when they evaluated her this evening, with tachypnea and tachycardia.  When I saw her, she was sleeping and appeared mildly tachypneic but comfortable; her heart rate was 106; exam showed clear lungs; regular rhythm; soft, moderately distended abdomen with some lower abdominal guarding to palpation.  Her heart rate trend shows periods of tachycardia in the mid 140s today, with the rate more often being in the 100-120 range.  Last CBC shows WBC 14.4, hemoglobin 9.6, platelets 268.  CT angiogram of the abdomen and pelvis showed no arterial contrast extravasation or abnormal blood vessel identified in the region of the left pelvic abnormality; based on its unusual mixed density appearance as well as a moderate amount of ascites in the peritoneal cavity, an underlying neoplastic process could not be excluded.  Based on discussion by resident Dr. Shirlee Latch with Dr. Fredia Sorrow of IR, a paracentesis by interventional radiology is planned for tomorrow.  The etiology of the episodes of tachycardia and tachypnea is not clear; her heart rate has improved after receiving morphine, which would suggest that pain may be an important contributing factor.  Her hemoglobin is relatively stable, and the CT scan did not show signs of active bleeding.  Cultures so far are negative.  I also discussed patient this evening with Dr. Vassie Loll of critical care who will see her again this evening and make further recommendations.

## 2013-12-19 NOTE — Progress Notes (Signed)
@   approx. 2300 Pt HR increased to the 140s non sustaining. Dr.McLean at bedside when HR increased to 140s. Dr.McLean made aware of pt 12 lead EKG. Dr. Shirlee Latch reccommended to give metoprolol if pt sustains 140s.  Will continue to monitor pt. Pt not complaining of chest pain. Pt AxO, sitter at bedside.

## 2013-12-19 NOTE — Progress Notes (Signed)
Patient indicates that she is having abdominal pain. Abdomen is distended and tender to palpation. Patient moaning and unable to verbalize level of pain.  Patient's BP elevated and patient is tachycardic.  MD paged at [phone]

## 2013-12-19 NOTE — Progress Notes (Signed)
Subjective: Pt denies significant pain or nausea.    Objective: Vital signs in last 24 hours: Temp:  [97.5 F (36.4 C)-98.6 F (37 C)] 97.5 F (36.4 C) (12/25 0338) Pulse Rate:  [81-142] 107 (12/25 0338) Resp:  [20-34] 30 (12/25 0338) BP: (102-150)/(46-79) 129/66 mmHg (12/25 0338) SpO2:  [96 %-100 %] 96 % (12/25 0338) Weight:  [130 lb 6.4 oz (59.149 kg)-134 lb 11.2 oz (61.1 kg)] 134 lb 11.2 oz (61.1 kg) (12/25 0249) Last BM Date: 12/18/13  Intake/Output from previous day: 12/24 0701 - 12/25 0700 In: -  Out: 1075 [Urine:1075] Intake/Output this shift:    General appearance: no distress and sleepy, but arousable Resp: mild tachypnea Cardio: regular rate and rhythm GI: soft, mildly distended, minimal tenderness  Lab Results:   Recent Labs  12/18/13 2222 12/19/13 0316  WBC 13.7* 12.4*  HGB 9.9* 10.1*  HCT 31.0* 31.4*  PLT 269 259   BMET  Recent Labs  12/18/13 2222 12/19/13 0316  NA 139 137  K 4.4 4.7  CL 101 100  CO2 32 33*  GLUCOSE 153* 190*  BUN 16 15  CREATININE 0.71 0.68  CALCIUM 9.4 9.4   PT/INR  Recent Labs  12/04/2013 2316 12/18/13 2222  LABPROT 15.4* 14.4  INR 1.25 1.14   ABG No results found for this basename: PHART, PCO2, PO2, HCO3,  in the last 72 hours  Studies/Results: Ct Abdomen Pelvis Wo Contrast  12/18/2013   CLINICAL DATA:  Abdominal pain and hematuria.  Anemia.  EXAM: CT ABDOMEN AND PELVIS WITHOUT CONTRAST  TECHNIQUE: Multidetector CT imaging of the abdomen and pelvis was performed following the standard protocol without intravenous contrast.  COMPARISON:  Lumbar spine radiographs performed 09/24/2012  FINDINGS: Trace bilateral pleural effusions are noted, with mild right basilar atelectasis. Diffuse coronary artery calcifications are seen.  The liver and spleen are unremarkable in appearance. The gallbladder is within normal limits. The pancreas and adrenal glands are unremarkable.  There is mild bilateral renal atrophy. The  kidneys are otherwise unremarkable in appearance. There is no evidence of hydronephrosis. No renal or ureteral stones are seen. No perinephric stranding is appreciated.  No free fluid is identified. The small bowel is unremarkable in appearance. The stomach is within normal limits. No acute vascular abnormalities are seen. Diffuse calcification is noted along the abdominal aorta and its branches.  A large acute hematoma is noted at the left side of the upper pelvis, measuring 12.8 x 12.3 x 11.5 cm. This may be intraperitoneal or retroperitoneal in nature. A moderate amount of free fluid is noted within the abdomen and pelvis. This is predominantly of relatively low attenuation, though slightly increased attenuation is noted about the liver. This is concerning for either serosanguineous fluid or underlying relatively chronic bleeding into the abdomen and pelvis.  The appendix is not definitely seen; there is no evidence for appendicitis. Scattered diverticulosis is noted along the descending and sigmoid colon, without evidence of diverticulitis. The colon is otherwise unremarkable in appearance.  The bladder is mildly distended and grossly unremarkable in appearance. The patient is status post hysterectomy; no suspicious adnexal masses are seen. No inguinal lymphadenopathy is seen.  No acute osseous abnormalities are identified.  IMPRESSION: 1. Large acute hematoma at the left side of the upper pelvis, measuring 12.8 x 4.3 x 11.5 cm. This may be intraperitoneal or retroperitoneal in nature. Moderate amount of free fluid noted throughout the abdomen and pelvis, of relatively low attenuation, though slightly increased attenuation is seen about the  liver. This may reflect either serosanguineous fluid or underlying relatively chronic extension of blood from the hematoma into the abdomen and pelvis. 2. Scattered diverticulosis along the descending and sigmoid colon, without evidence of diverticulitis. 3. Diffuse  calcification along the abdominal aorta and its branches. 4. Trace bilateral pleural effusions, with mild right basilar atelectasis. 5. Diffuse coronary artery calcifications seen. 6. Mild bilateral renal atrophy noted.  Critical Value/emergent results were called by telephone at the time of interpretation on 12/18/2013 at 2:55 AM to Dr. Rolland Porter , who verbally acknowledged these results.   Electronically Signed   By: Roanna Raider M.D.   On: 12/18/2013 03:12   Ct Head Wo Contrast  12/18/2013   CLINICAL DATA:  Headache and vertigo; recent fall. Nausea and vomiting.  EXAM: CT HEAD WITHOUT CONTRAST  TECHNIQUE: Contiguous axial images were obtained from the base of the skull through the vertex without intravenous contrast.  COMPARISON:  CT of the head performed 09/29/2012  FINDINGS: There is no evidence of acute infarction, mass lesion, or intra- or extra-axial hemorrhage on CT. Evaluation is mildly suboptimal due to motion artifact.  Scattered periventricular and subcortical white matter change likely reflects small vessel ischemic microangiopathy.  The posterior fossa, including the cerebellum, brainstem and fourth ventricle, is within normal limits. The third and lateral ventricles, and basal ganglia are unremarkable in appearance. The cerebral hemispheres are symmetric in appearance, with normal gray-white differentiation. No mass effect or midline shift is seen.  There is no evidence of fracture; visualized osseous structures are unremarkable in appearance. The orbits are within normal limits. The paranasal sinuses and mastoid air cells are well-aerated. No significant soft tissue abnormalities are seen.  IMPRESSION: 1. No evidence of traumatic intracranial injury or fracture. 2. Scattered small vessel ischemic microangiopathy.   Electronically Signed   By: Roanna Raider M.D.   On: 12/18/2013 00:23   Dg Chest Port 1 View  12/18/2013   CLINICAL DATA:  Difficulty breathing  EXAM: PORTABLE CHEST - 1 VIEW   COMPARISON:  12/18/2013  FINDINGS: Rotated exam to the right. Skin folds overlie the right hemithorax. Normal heart size and vascularity. Grossly clear lungs without focal pneumonia, collapse or consolidation. No pleural fluid or pneumothorax. Stable exam.  IMPRESSION: Stable exam.  No superimposed acute process   Electronically Signed   By: Ruel Favors M.D.   On: 12/18/2013 19:48   Portable Chest 1 View  12/18/2013   CLINICAL DATA:  Wheezing  EXAM: PORTABLE CHEST - 1 VIEW  COMPARISON:  07/01/2013  FINDINGS: The cardiac shadow is within normal limits. The lungs are well aerated bilaterally without focal infiltrate or sizable effusion. Persistent mild interstitial changes are identified without focal infiltrate.  IMPRESSION: Chronic changes without acute abnormality.   Electronically Signed   By: Alcide Clever M.D.   On: 12/18/2013 10:42    Anti-infectives: Anti-infectives   None      Assessment/Plan: s/p * No surgery found * retroperitoneal bleed Hold plavix and other anticoagulants If becomes unstable, I recommend angiogram.  We do not open retroperitoneum in cases of spontaneous hemorrhage due to release of tamponade. Serial H&H. Transfuse if needed.    LOS: 2 days    [facility] 12/19/2013

## 2013-12-19 NOTE — Consult Note (Signed)
Name: [name] MRN: [id number] DOB: 27-Jun-1933    ADMISSION DATE:  11/27/2013 CONSULTATION DATE:  12/18/13  REFERRING MD :  Danise Edge PRIMARY SERVICE:  [facility]  CHIEF COMPLAINT:  Pelvic hematoma,tachycardia  BRIEF PATIENT DESCRIPTION: 79 y.o woman NHR mild dementia and past h/o of CAD on ASA/plavix -taken to [facility] with hypotension/tachycarida and drop in Hgb Abd CT showed pelvic hematoma - txf to [facility] for evaluation by trauma first given recent fall. Felt to have Spontaneous large RP hemorrhage that has leaked  Intraperitoneally. Developed tachycardia and increased abdominal distension requiring transfer to SDU, hence PCCM consulted.  SIGNIFICANT EVENTS / STUDIES:  12/24 CT abd -Large acute hematoma at the left side of the upper pelvis, measuring 12.8 x 4.3 x 11.5 cm. This may be intraperitoneal or retroperitoneal in nature. Moderate amount of free fluid noted throughout the abdomen and pelvis, of relatively low attenuation, though slightly increased attenuation is seen about the liver. This  may reflect either serosanguineous fluid or underlying relatively chronic extension of blood from the hematoma into the abdomen and pelvis. 12/24 head CT neg     PAST MEDICAL HISTORY :  Past Medical History  Diagnosis Date  . Coronary artery disease     multivessel  . Hypotension   . Peripheral artery disease   . Hypertension     hx  . Diabetes mellitus, type 2   . Dyslipidemia   . Tobacco user     remote  . Stroke   . COPD (chronic obstructive pulmonary disease)   . Dementia    History reviewed. No pertinent past surgical history. Prior to Admission medications   Medication Sig Start Date End Date Taking? Authorizing Provider  acetaminophen (TYLENOL) 325 MG tablet Take 2 tablets (650 mg total) by mouth every 4 (four) hours as needed. 07/06/13  Yes Jodelle Gross, NP  albuterol (PROVENTIL HFA;VENTOLIN HFA) 108 (90 BASE) MCG/ACT inhaler Inhale 2 puffs into the lungs every 6 (six)  hours as needed for wheezing.   Yes Historical Provider, MD  aspirin 81 MG chewable tablet Chew 81 mg by mouth daily.   Yes Historical Provider, MD  bisacodyl (DULCOLAX) 10 MG suppository Place 1 suppository (10 mg total) rectally as needed. 07/06/13  Yes Jodelle Gross, NP  citalopram (CELEXA) 20 MG tablet Take 20 mg by mouth daily.     Yes Historical Provider, MD  clopidogrel (PLAVIX) 75 MG tablet Take 75 mg by mouth daily.     Yes Historical Provider, MD  divalproex (DEPAKOTE ER) 250 MG 24 hr tablet Take 250 mg by mouth 2 (two) times daily.    Yes Historical Provider, MD  furosemide (LASIX) 20 MG tablet Take 20 mg by mouth daily.   Yes Historical Provider, MD  glipiZIDE (GLUCOTROL) 10 MG tablet Take 10 mg by mouth daily.     Yes Historical Provider, MD  Lactobacillus (ACIDOPHILUS PO) Take 1 capsule by mouth 2 (two) times daily. 2 week course starting on 12/16/13   Yes Historical Provider, MD  levofloxacin (LEVAQUIN) 500 MG tablet Take 500 mg by mouth daily. 10 day course starting on 12/19/2013 12/10/2013  Yes Historical Provider, MD  lisinopril (PRINIVIL,ZESTRIL) 20 MG tablet Take 20 mg by mouth daily.   Yes Historical Provider, MD  Melatonin 3 MG TABS Take 3 mg by mouth at bedtime.   Yes Historical Provider, MD  nitroGLYCERIN (NITROSTAT) 0.4 MG SL tablet Place 0.4 mg under the tongue every 5 (five) minutes as needed.  Historical Provider, MD   Allergies  Allergen Reactions  . Atorvastatin     REACTION: myalgia  . Other     Reductase Inhibitors   . Penicillins     REACTION: rash    FAMILY HISTORY:  History reviewed. No pertinent family history. SOCIAL HISTORY:  reports that she has never smoked. She does not have any smokeless tobacco history on file. She reports that she does not drink alcohol or use illicit drugs.  REVIEW OF SYSTEMS:   Constitutional: Negative for fever, chills, weight loss, malaise/fatigue and diaphoresis.  HENT: Negative for hearing loss, ear pain,  nosebleeds, congestion, sore throat, neck pain, tinnitus and ear discharge.   Eyes: Negative for blurred vision, double vision, photophobia, pain, discharge and redness.  Respiratory: Negative for cough, hemoptysis, sputum production, shortness of breath, wheezing and stridor.   Cardiovascular: Negative for chest pain, palpitations, orthopnea, claudication, leg swelling and PND.  Gastrointestinal: Negative for heartburn, nausea, vomiting, abdominal pain, diarrhea, constipation, blood in stool and melena.  Genitourinary: Negative for dysuria, urgency, frequency, hematuria and flank pain.  Musculoskeletal: Negative for myalgias, back pain, joint pain and falls.  Skin: Negative for itching and rash.  Neurological: Negative for dizziness, tingling, tremors, sensory change, speech change, focal weakness, seizures, loss of consciousness, weakness and headaches.  Endo/Heme/Allergies: Negative for environmental allergies and polydipsia. Does not bruise/bleed easily.  SUBJECTIVE:   VITAL SIGNS: Temp:  [97.9 F (36.6 C)-98.8 F (37.1 C)] 97.9 F (36.6 C) (12/24 2333) Pulse Rate:  [81-153] 119 (12/24 2333) Resp:  [20-34] 25 (12/24 2333) BP: (78-170)/(40-79) 150/75 mmHg (12/24 2333) SpO2:  [88 %-100 %] 100 % (12/24 2333) Weight:  [59.149 kg (130 lb 6.4 oz)] 59.149 kg (130 lb 6.4 oz) (12/24 1300)  PHYSICAL EXAMINATION: Gen. Pleasant, well-nourished, in no distress, normal affect ENT - no lesions, no post nasal drip Neck: No JVD, no thyromegaly, no carotid bruits Lungs: no use of accessory muscles, no dullness to percussion, clear without rales or rhonchi  Cardiovascular: Rhythm regular, heart sounds  normal, no murmurs, no peripheral edema Abdomen: soft and non-tender,mild distension, no hepatosplenomegaly, BS normal. Musculoskeletal: No deformities, no cyanosis or clubbing Neuro:  alert, non focal Skin:  Warm, no lesions/ rash    Recent Labs Lab 12/20/2013 2316 12/18/13 2222  NA 129* 139    K 4.1 4.4  CL 89* 101  CO2 30 32  BUN 31* 16  CREATININE 2.50* 0.71  GLUCOSE 206* 153*    Recent Labs Lab 12/18/13 1103 12/18/13 1701 12/18/13 2222  HGB 10.7* 9.7* 9.9*  HCT 33.0* 29.9* 31.0*  WBC 13.6* 12.7* 13.7*  PLT 239 238 269   Ct Abdomen Pelvis Wo Contrast  12/18/2013   CLINICAL DATA:  Abdominal pain and hematuria.  Anemia.  EXAM: CT ABDOMEN AND PELVIS WITHOUT CONTRAST  TECHNIQUE: Multidetector CT imaging of the abdomen and pelvis was performed following the standard protocol without intravenous contrast.  COMPARISON:  Lumbar spine radiographs performed 09/24/2012  FINDINGS: Trace bilateral pleural effusions are noted, with mild right basilar atelectasis. Diffuse coronary artery calcifications are seen.  The liver and spleen are unremarkable in appearance. The gallbladder is within normal limits. The pancreas and adrenal glands are unremarkable.  There is mild bilateral renal atrophy. The kidneys are otherwise unremarkable in appearance. There is no evidence of hydronephrosis. No renal or ureteral stones are seen. No perinephric stranding is appreciated.  No free fluid is identified. The small bowel is unremarkable in appearance. The stomach is within normal limits.  No acute vascular abnormalities are seen. Diffuse calcification is noted along the abdominal aorta and its branches.  A large acute hematoma is noted at the left side of the upper pelvis, measuring 12.8 x 12.3 x 11.5 cm. This may be intraperitoneal or retroperitoneal in nature. A moderate amount of free fluid is noted within the abdomen and pelvis. This is predominantly of relatively low attenuation, though slightly increased attenuation is noted about the liver. This is concerning for either serosanguineous fluid or underlying relatively chronic bleeding into the abdomen and pelvis.  The appendix is not definitely seen; there is no evidence for appendicitis. Scattered diverticulosis is noted along the descending and sigmoid  colon, without evidence of diverticulitis. The colon is otherwise unremarkable in appearance.  The bladder is mildly distended and grossly unremarkable in appearance. The patient is status post hysterectomy; no suspicious adnexal masses are seen. No inguinal lymphadenopathy is seen.  No acute osseous abnormalities are identified.  IMPRESSION: 1. Large acute hematoma at the left side of the upper pelvis, measuring 12.8 x 4.3 x 11.5 cm. This may be intraperitoneal or retroperitoneal in nature. Moderate amount of free fluid noted throughout the abdomen and pelvis, of relatively low attenuation, though slightly increased attenuation is seen about the liver. This may reflect either serosanguineous fluid or underlying relatively chronic extension of blood from the hematoma into the abdomen and pelvis. 2. Scattered diverticulosis along the descending and sigmoid colon, without evidence of diverticulitis. 3. Diffuse calcification along the abdominal aorta and its branches. 4. Trace bilateral pleural effusions, with mild right basilar atelectasis. 5. Diffuse coronary artery calcifications seen. 6. Mild bilateral renal atrophy noted.  Critical Value/emergent results were called by telephone at the time of interpretation on 12/18/2013 at 2:55 AM to Dr. Rolland Porter , who verbally acknowledged these results.   Electronically Signed   By: Roanna Raider M.D.   On: 12/18/2013 03:12   Ct Head Wo Contrast  12/18/2013   CLINICAL DATA:  Headache and vertigo; recent fall. Nausea and vomiting.  EXAM: CT HEAD WITHOUT CONTRAST  TECHNIQUE: Contiguous axial images were obtained from the base of the skull through the vertex without intravenous contrast.  COMPARISON:  CT of the head performed 09/29/2012  FINDINGS: There is no evidence of acute infarction, mass lesion, or intra- or extra-axial hemorrhage on CT. Evaluation is mildly suboptimal due to motion artifact.  Scattered periventricular and subcortical white matter change likely  reflects small vessel ischemic microangiopathy.  The posterior fossa, including the cerebellum, brainstem and fourth ventricle, is within normal limits. The third and lateral ventricles, and basal ganglia are unremarkable in appearance. The cerebral hemispheres are symmetric in appearance, with normal gray-white differentiation. No mass effect or midline shift is seen.  There is no evidence of fracture; visualized osseous structures are unremarkable in appearance. The orbits are within normal limits. The paranasal sinuses and mastoid air cells are well-aerated. No significant soft tissue abnormalities are seen.  IMPRESSION: 1. No evidence of traumatic intracranial injury or fracture. 2. Scattered small vessel ischemic microangiopathy.   Electronically Signed   By: Roanna Raider M.D.   On: 12/18/2013 00:23   Dg Chest Port 1 View  12/18/2013   CLINICAL DATA:  Difficulty breathing  EXAM: PORTABLE CHEST - 1 VIEW  COMPARISON:  12/18/2013  FINDINGS: Rotated exam to the right. Skin folds overlie the right hemithorax. Normal heart size and vascularity. Grossly clear lungs without focal pneumonia, collapse or consolidation. No pleural fluid or pneumothorax. Stable exam.  IMPRESSION:  Stable exam.  No superimposed acute process   Electronically Signed   By: Ruel Favors M.D.   On: 12/18/2013 19:48   Portable Chest 1 View  12/18/2013   CLINICAL DATA:  Wheezing  EXAM: PORTABLE CHEST - 1 VIEW  COMPARISON:  07/01/2013  FINDINGS: The cardiac shadow is within normal limits. The lungs are well aerated bilaterally without focal infiltrate or sizable effusion. Persistent mild interstitial changes are identified without focal infiltrate.  IMPRESSION: Chronic changes without acute abnormality.   Electronically Signed   By: Alcide Clever M.D.   On: 12/18/2013 10:42    ASSESSMENT / PLAN:  Acute blood loss anemia due to RP hemorrhage , spontaneous while onASA/ plavix -Hb stable post transfusion, suggesting that bleeding has  likely stopped -Monitor abdominal girth, if Hb falls further, can consider pelvic angiogram by IR -this is rarely required, trauma following  Atrial tachycardia- resolved with lopressor, ct IV 2.5-5 mg q6h prn with paramters  Htn- hold lisinopril, since high risk AKI  PCCM available as needed   [facility]  Pulmonary and Critical Care Medicine [facility] Pager: [phone]  12/19/2013, 12:24 AM

## 2013-12-19 NOTE — Progress Notes (Signed)
Name: [name] MRN: [id number] DOB: Jan 03, 1933    ADMISSION DATE:  2014-01-04 CONSULTATION DATE:  12/18/13  REFERRING MD :  Danise Edge PRIMARY SERVICE:  IMTS  CHIEF COMPLAINT:  Pelvic hematoma,tachycardia  BRIEF PATIENT DESCRIPTION: 79 y.o woman NHR mild dementia and past h/o of CAD on ASA/plavix -taken to [facility] with hypotension/tachycarida and drop in Hgb Abd CT showed pelvic hematoma - txf to [facility] for evaluation by trauma first given recent fall. Felt to have Spontaneous large RP hemorrhage that has leaked  Intraperitoneally. Developed tachycardia and increased abdominal distension requiring transfer to SDU, hence PCCM consulted.  SIGNIFICANT EVENTS / STUDIES:  12/24 CT abd -Large acute hematoma at the left side of the upper pelvis, measuring 12.8 x 4.3 x 11.5 cm. This may be intraperitoneal or retroperitoneal in nature. Moderate amount of free fluid noted throughout the abdomen and pelvis, of relatively low attenuation, though slightly increased attenuation is seen about the liver. This  may reflect either serosanguineous fluid or underlying relatively chronic extension of blood from the hematoma into the abdomen and pelvis. 12/24 head CT neg   SUBJECTIVE: received morphine Asked to see again by IMTS due tot achycardia  VITAL SIGNS: Temp:  [97.5 F (36.4 C)-98.4 F (36.9 C)] 98.4 F (36.9 C) (12/25 1540) Pulse Rate:  [107-149] 149 (12/25 2000) Resp:  [21-30] 23 (12/25 2000) BP: (129-156)/(62-98) 153/72 mmHg (12/25 2000) SpO2:  [94 %-100 %] 98 % (12/25 2000) Weight:  [61.1 kg (134 lb 11.2 oz)] 61.1 kg (134 lb 11.2 oz) (12/25 0249)  PHYSICAL EXAMINATION: Gen. Pleasant, well-nourished, in no distress, on 3L [place] [facility] - no lesions, no post nasal drip Neck: No JVD, no thyromegaly, no carotid bruits Lungs: no use of accessory muscles, no dullness to percussion, clear without rales or rhonchi  Cardiovascular: Rhythm regular, heart sounds  normal, no murmurs, no peripheral  edema Abdomen: soft and non-tender,mild distension, no hepatosplenomegaly, BS normal. Musculoskeletal: No deformities, no cyanosis or clubbing Neuro:  Less responsive after morphine, non focal Skin:  Warm, no lesions/ rash    Recent Labs Lab 01/04/2014 2316 12/18/13 2222 12/19/13 0316  NA 129* 139 137  K 4.1 4.4 4.7  CL 89* 101 100  CO2 30 32 33*  BUN 31* 16 15  CREATININE 2.50* 0.71 0.68  GLUCOSE 206* 153* 190*    Recent Labs Lab 12/19/13 0316 12/19/13 1230 12/19/13 1856  HGB 10.1* 10.4* 9.6*  HCT 31.4* 33.2* 30.6*  WBC 12.4* 12.8* 14.4*  PLT 259 279 268   Ct Abdomen Pelvis Wo Contrast  12/18/2013   CLINICAL DATA:  Abdominal pain and hematuria.  Anemia.  EXAM: CT ABDOMEN AND PELVIS WITHOUT CONTRAST  TECHNIQUE: Multidetector CT imaging of the abdomen and pelvis was performed following the standard protocol without intravenous contrast.  COMPARISON:  Lumbar spine radiographs performed 09/24/2012  FINDINGS: Trace bilateral pleural effusions are noted, with mild right basilar atelectasis. Diffuse coronary artery calcifications are seen.  The liver and spleen are unremarkable in appearance. The gallbladder is within normal limits. The pancreas and adrenal glands are unremarkable.  There is mild bilateral renal atrophy. The kidneys are otherwise unremarkable in appearance. There is no evidence of hydronephrosis. No renal or ureteral stones are seen. No perinephric stranding is appreciated.  No free fluid is identified. The small bowel is unremarkable in appearance. The stomach is within normal limits. No acute vascular abnormalities are seen. Diffuse calcification is noted along the abdominal aorta and its branches.  A large acute hematoma  is noted at the left side of the upper pelvis, measuring 12.8 x 12.3 x 11.5 cm. This may be intraperitoneal or retroperitoneal in nature. A moderate amount of free fluid is noted within the abdomen and pelvis. This is predominantly of relatively low  attenuation, though slightly increased attenuation is noted about the liver. This is concerning for either serosanguineous fluid or underlying relatively chronic bleeding into the abdomen and pelvis.  The appendix is not definitely seen; there is no evidence for appendicitis. Scattered diverticulosis is noted along the descending and sigmoid colon, without evidence of diverticulitis. The colon is otherwise unremarkable in appearance.  The bladder is mildly distended and grossly unremarkable in appearance. The patient is status post hysterectomy; no suspicious adnexal masses are seen. No inguinal lymphadenopathy is seen.  No acute osseous abnormalities are identified.  IMPRESSION: 1. Large acute hematoma at the left side of the upper pelvis, measuring 12.8 x 4.3 x 11.5 cm. This may be intraperitoneal or retroperitoneal in nature. Moderate amount of free fluid noted throughout the abdomen and pelvis, of relatively low attenuation, though slightly increased attenuation is seen about the liver. This may reflect either serosanguineous fluid or underlying relatively chronic extension of blood from the hematoma into the abdomen and pelvis. 2. Scattered diverticulosis along the descending and sigmoid colon, without evidence of diverticulitis. 3. Diffuse calcification along the abdominal aorta and its branches. 4. Trace bilateral pleural effusions, with mild right basilar atelectasis. 5. Diffuse coronary artery calcifications seen. 6. Mild bilateral renal atrophy noted.  Critical Value/emergent results were called by telephone at the time of interpretation on 12/18/2013 at 2:55 AM to Dr. Rolland Porter , who verbally acknowledged these results.   Electronically Signed   By: Roanna Raider M.D.   On: 12/18/2013 03:12   Ct Head Wo Contrast  12/18/2013   CLINICAL DATA:  Headache and vertigo; recent fall. Nausea and vomiting.  EXAM: CT HEAD WITHOUT CONTRAST  TECHNIQUE: Contiguous axial images were obtained from the base of the  skull through the vertex without intravenous contrast.  COMPARISON:  CT of the head performed 09/29/2012  FINDINGS: There is no evidence of acute infarction, mass lesion, or intra- or extra-axial hemorrhage on CT. Evaluation is mildly suboptimal due to motion artifact.  Scattered periventricular and subcortical white matter change likely reflects small vessel ischemic microangiopathy.  The posterior fossa, including the cerebellum, brainstem and fourth ventricle, is within normal limits. The third and lateral ventricles, and basal ganglia are unremarkable in appearance. The cerebral hemispheres are symmetric in appearance, with normal gray-white differentiation. No mass effect or midline shift is seen.  There is no evidence of fracture; visualized osseous structures are unremarkable in appearance. The orbits are within normal limits. The paranasal sinuses and mastoid air cells are well-aerated. No significant soft tissue abnormalities are seen.  IMPRESSION: 1. No evidence of traumatic intracranial injury or fracture. 2. Scattered small vessel ischemic microangiopathy.   Electronically Signed   By: Roanna Raider M.D.   On: 12/18/2013 00:23   Dg Chest Port 1 View  12/18/2013   CLINICAL DATA:  Difficulty breathing  EXAM: PORTABLE CHEST - 1 VIEW  COMPARISON:  12/18/2013  FINDINGS: Rotated exam to the right. Skin folds overlie the right hemithorax. Normal heart size and vascularity. Grossly clear lungs without focal pneumonia, collapse or consolidation. No pleural fluid or pneumothorax. Stable exam.  IMPRESSION: Stable exam.  No superimposed acute process   Electronically Signed   By: Ruel Favors M.D.   On: 12/18/2013  19:48   Portable Chest 1 View  12/18/2013   CLINICAL DATA:  Wheezing  EXAM: PORTABLE CHEST - 1 VIEW  COMPARISON:  07/01/2013  FINDINGS: The cardiac shadow is within normal limits. The lungs are well aerated bilaterally without focal infiltrate or sizable effusion. Persistent mild interstitial  changes are identified without focal infiltrate.  IMPRESSION: Chronic changes without acute abnormality.   Electronically Signed   By: Alcide Clever M.D.   On: 12/18/2013 10:42   Ct Angio Abd/pel W/ And/or W/o  12/19/2013   CLINICAL DATA:  Anemia and left pelvic hematoma. CTA assessment for possible vascular etiology.  EXAM: CT ANGIOGRAPHY ABDOMEN AND PELVIS WITH CONTRAST AND WITHOUT CONTRAST  TECHNIQUE: Multidetector CT imaging of the abdomen and pelvis was performed using the standard protocol during bolus administration of intravenous contrast. Multiplanar reconstructed images including MIPs were obtained and reviewed to evaluate the vascular anatomy.  CONTRAST:  OMNIPAQUE IOHEXOL 350 MG/ML SOLN  COMPARISON:  Unenhanced CT of the abdomen and pelvis on 12/18/2013.  FINDINGS: Size of the area of mixed density in the left pelvis is stable, measuring just under 13 cm in greatest diameter. During arterial phase imaging, no extravasation of contrast material or abnormal blood vessel is identified in this region. The soft tissue abnormality continues to demonstrate irregular mixed attenuation. A moderate amount of lower density ascites is also again identified throughout the peritoneal cavity.  The left pelvic abnormality is somewhat atypical in appearance for a simple hematoma and needs to be followed/ further assessed. There are some amorphous bowel loops that extend through this region and the presence of ascites and stranding in the mesentery may also be suggestive of an underlying neoplastic process. The dominant abnormality may be a bleeding tumor rather than a simple spontaneous bleed.  Vascular assessment demonstrates diffuse atherosclerotic calcification of the abdominal aorta and iliac arteries. Major branch vessels show no significant stenosis of the celiac axis, superior mesenteric artery, renal arteries or inferior mesenteric artery. Diffuse calcification of the iliac arteries present with diffuse  disease present but no significant occlusion or evidence of aneurysm. The common femoral arteries are diffusely calcified and diseased bilaterally.  No visible overtly enlarged lymph nodes are identified. There is no evidence of bowel obstruction or perforation. The liver shows no overt cirrhotic changes. The spleen shows irregular enhancement, which may be a normal perfusion pattern in the arterial phase. Both kidneys are unremarkable. No obvious pancreatic lesions are seen. There is somewhat limited evaluation of abdominal contents as the arms and hands overly the body, creating streak artifact. Bony structures show osteopenia without evidence of lesion or fracture.  The visualized lung bases show a small right pleural effusion with right basilar atelectasis. The bladder is decompressed by a Foley catheter. No hernias are seen. The uterus appears to have been removed.  Review of the MIP images confirms the above findings.  IMPRESSION: No arterial contrast extravasation or abnormal blood vessel is identified in the region of the left pelvic abnormality. Based on its unusual mixed density appearance as well as a moderate amount of ascites in the peritoneal cavity, an underlying neoplastic process cannot be excluded. When better able, there may be a benefit in performing a followup CT of the abdomen and pelvis with oral contrast and during a venous phase of imaging.   Electronically Signed   By: Irish Lack M.D.   On: 12/19/2013 18:21    ASSESSMENT / PLAN:  Acute blood loss anemia due to RP hemorrhage ,  spontaneous while onASA/ plavix -Hb stable post transfusion, suggesting that bleeding has likely stopped -Agree with plan for IR paracentesis - atypical appearance of hematoma does raise the question of malignant ascites, send for cytology besides chemistry, cell count -Monitor abdominal girth,  trauma following  Atrial tachycardia- ct lopressor, ct IV 2.5-5 mg q6h prn with paramters  Htn- hold  lisinopril, since high risk AKI  PCCM will follow Goals of care discussion in order regardless of paracentesis results   Oretha Milch  Pulmonary and Critical Care Medicine [facility] Pager: [phone]  12/19/2013, 11:15 PM

## 2013-12-19 NOTE — Progress Notes (Signed)
Subjective:    Interval Events:  Increased work of breathing and pain. She is tachycardic in the rate of 110-115. On 4 L via [place]. Discussed with daughter at bedside.     Objective:    Vital Signs:   Temp:  [97.5 F (36.4 C)-98.6 F (37 C)] 97.9 F (36.6 C) (12/25 0813) Pulse Rate:  [107-142] 118 (12/25 1200) Resp:  [20-34] 26 (12/25 1200) BP: (111-155)/(61-79) 155/67 mmHg (12/25 1200) SpO2:  [96 %-100 %] 99 % (12/25 1200) Weight:  [130 lb 6.4 oz (59.149 kg)-134 lb 11.2 oz (61.1 kg)] 134 lb 11.2 oz (61.1 kg) (12/25 0249) Last BM Date: 12/18/13   Weights: 24-hour Weight change:   Filed Weights   12/18/13 1300 12/19/13 0249  Weight: 130 lb 6.4 oz (59.149 kg) 134 lb 11.2 oz (61.1 kg)     Intake/Output:   Intake/Output Summary (Last 24 hours) at 12/19/13 1230 Last data filed at 12/19/13 0636  Gross per 24 hour  Intake      0 ml  Output   1075 ml  Net  -1075 ml     Physical Exam: General: Appears to be in moderate respiratory distress. She is otherwise alert. Daughter at bedside.  Lungs: Increased  respiratory effort. Clear to auscultation BL without crackles or wheezes.  Heart: Tachycardia. S1 and S2 normal without gallop, murmur, or rubs.  Abdomen: Distended. Diffusely TTP with rebound tenderness. Unable to assess for organomegaly due to pain.  Extremities: No pretibial edema.  Neuro: Alert. Pleasantly confused.  Labs: Basic Metabolic Panel:  Recent Labs Lab 11/27/2013 2316 12/18/13 1103 12/18/13 2222 12/19/13 0316  NA 129*  --  139 137  K 4.1  --  4.4 4.7  CL 89*  --  101 100  CO2 30  --  32 33*  GLUCOSE 206*  --  153* 190*  BUN 31*  --  16 15  CREATININE 2.50*  --  0.71 0.68  CALCIUM 9.7  --  9.4 9.4  MG  --  1.9  --   --   PHOS  --  2.6  --   --     Liver Function Tests:  Recent Labs Lab 12/16/2013 2316 12/19/13 0316  AST 12 15  ALT 5 6  ALKPHOS 37* 45  BILITOT 0.4 0.8  PROT 5.9* 6.4  ALBUMIN 2.9* 2.9*    CBC:  Recent Labs Lab  11/28/2013 2258  12/18/13 0320 12/18/13 1103 12/18/13 1701 12/18/13 2222 12/19/13 0316  WBC 10.6*  < > 12.3* 13.6* 12.7* 13.7* 12.4*  NEUTROABS 7.9*  --  8.7*  --   --   --   --   HGB 5.7*  < > 9.1* 10.7* 9.7* 9.9* 10.1*  HCT 17.7*  < > 27.1* 33.0* 29.9* 31.0* 31.4*  MCV 80.5  < > 84.2 84.0 84.7 85.6 86.3  PLT 236  < > 210 239 238 269 259  < > = values in this interval not displayed.  Cardiac Enzymes:  Recent Labs Lab 11/27/2013 2316 12/18/13 1103 12/18/13 1700 12/18/13 2212  TROPONINI <0.30 <0.30 <0.30 <0.30    CBG:  Recent Labs Lab 12/18/13 1126 12/18/13 1619  GLUCAP 116* 121*    Coagulation Studies:  Recent Labs  12/06/2013 2316 12/18/13 2222  LABPROT 15.4* 14.4  INR 1.25 1.14    Microbiology: Blood culture negative today    Imaging: Ct Abdomen Pelvis Wo Contrast  12/18/2013   CLINICAL DATA:  Abdominal pain and hematuria.  Anemia.  EXAM:  CT ABDOMEN AND PELVIS WITHOUT CONTRAST  TECHNIQUE: Multidetector CT imaging of the abdomen and pelvis was performed following the standard protocol without intravenous contrast.  COMPARISON:  Lumbar spine radiographs performed 09/24/2012  FINDINGS: Trace bilateral pleural effusions are noted, with mild right basilar atelectasis. Diffuse coronary artery calcifications are seen.  The liver and spleen are unremarkable in appearance. The gallbladder is within normal limits. The pancreas and adrenal glands are unremarkable.  There is mild bilateral renal atrophy. The kidneys are otherwise unremarkable in appearance. There is no evidence of hydronephrosis. No renal or ureteral stones are seen. No perinephric stranding is appreciated.  No free fluid is identified. The small bowel is unremarkable in appearance. The stomach is within normal limits. No acute vascular abnormalities are seen. Diffuse calcification is noted along the abdominal aorta and its branches.  A large acute hematoma is noted at the left side of the upper pelvis, measuring  12.8 x 12.3 x 11.5 cm. This may be intraperitoneal or retroperitoneal in nature. A moderate amount of free fluid is noted within the abdomen and pelvis. This is predominantly of relatively low attenuation, though slightly increased attenuation is noted about the liver. This is concerning for either serosanguineous fluid or underlying relatively chronic bleeding into the abdomen and pelvis.  The appendix is not definitely seen; there is no evidence for appendicitis. Scattered diverticulosis is noted along the descending and sigmoid colon, without evidence of diverticulitis. The colon is otherwise unremarkable in appearance.  The bladder is mildly distended and grossly unremarkable in appearance. The patient is status post hysterectomy; no suspicious adnexal masses are seen. No inguinal lymphadenopathy is seen.  No acute osseous abnormalities are identified.  IMPRESSION: 1. Large acute hematoma at the left side of the upper pelvis, measuring 12.8 x 4.3 x 11.5 cm. This may be intraperitoneal or retroperitoneal in nature. Moderate amount of free fluid noted throughout the abdomen and pelvis, of relatively low attenuation, though slightly increased attenuation is seen about the liver. This may reflect either serosanguineous fluid or underlying relatively chronic extension of blood from the hematoma into the abdomen and pelvis. 2. Scattered diverticulosis along the descending and sigmoid colon, without evidence of diverticulitis. 3. Diffuse calcification along the abdominal aorta and its branches. 4. Trace bilateral pleural effusions, with mild right basilar atelectasis. 5. Diffuse coronary artery calcifications seen. 6. Mild bilateral renal atrophy noted.  Critical Value/emergent results were called by telephone at the time of interpretation on 12/18/2013 at 2:55 AM to Dr. Rolland Porter , who verbally acknowledged these results.   Electronically Signed   By: Roanna Raider M.D.   On: 12/18/2013 03:12   Ct Head Wo  Contrast  12/18/2013   CLINICAL DATA:  Headache and vertigo; recent fall. Nausea and vomiting.  EXAM: CT HEAD WITHOUT CONTRAST  TECHNIQUE: Contiguous axial images were obtained from the base of the skull through the vertex without intravenous contrast.  COMPARISON:  CT of the head performed 09/29/2012  FINDINGS: There is no evidence of acute infarction, mass lesion, or intra- or extra-axial hemorrhage on CT. Evaluation is mildly suboptimal due to motion artifact.  Scattered periventricular and subcortical white matter change likely reflects small vessel ischemic microangiopathy.  The posterior fossa, including the cerebellum, brainstem and fourth ventricle, is within normal limits. The third and lateral ventricles, and basal ganglia are unremarkable in appearance. The cerebral hemispheres are symmetric in appearance, with normal gray-white differentiation. No mass effect or midline shift is seen.  There is no evidence of fracture;  visualized osseous structures are unremarkable in appearance. The orbits are within normal limits. The paranasal sinuses and mastoid air cells are well-aerated. No significant soft tissue abnormalities are seen.  IMPRESSION: 1. No evidence of traumatic intracranial injury or fracture. 2. Scattered small vessel ischemic microangiopathy.   Electronically Signed   By: Roanna Raider M.D.   On: 12/18/2013 00:23   Dg Chest Port 1 View  12/18/2013   CLINICAL DATA:  Difficulty breathing  EXAM: PORTABLE CHEST - 1 VIEW  COMPARISON:  12/18/2013  FINDINGS: Rotated exam to the right. Skin folds overlie the right hemithorax. Normal heart size and vascularity. Grossly clear lungs without focal pneumonia, collapse or consolidation. No pleural fluid or pneumothorax. Stable exam.  IMPRESSION: Stable exam.  No superimposed acute process   Electronically Signed   By: Ruel Favors M.D.   On: 12/18/2013 19:48   Portable Chest 1 View  12/18/2013   CLINICAL DATA:  Wheezing  EXAM: PORTABLE CHEST - 1  VIEW  COMPARISON:  07/01/2013  FINDINGS: The cardiac shadow is within normal limits. The lungs are well aerated bilaterally without focal infiltrate or sizable effusion. Persistent mild interstitial changes are identified without focal infiltrate.  IMPRESSION: Chronic changes without acute abnormality.   Electronically Signed   By: Alcide Clever M.D.   On: 12/18/2013 10:42      Medications:    Infusions:     Scheduled Medications: . citalopram  20 mg Oral Daily  . divalproex  250 mg Oral BID  . sodium chloride  500 mL Intravenous Once     PRN Medications: albuterol, bisacodyl, haloperidol, metoprolol   Assessment/ Plan:    # Acute blood loss Anemia- initial abd/pelvis Ct findings in the abdomen with a large acute intraabd bleed-?intra or extra abd hematoma. Patient was on Aspirin and Clopidogrel for CAD with steps prior to fall in NH. She was hypotensive on presentation.  Pre transfusion Hbg of 5.7 and Post transfusion HGB- 10.7. Currently hgb stable. Exam with signs of peritoneal irritation which is making her dyspneic. Plan  - BP is stable today, though tachycardic - monitor with CBCs Q8H - transfuse as needed with goal Hgb 8.0.    - Fall precautions  - Hold Aspirin and plavix; coags are normal  - discussed with IR who recommended CTA of pelvis and abdomen to ascertain any continued bleeding. Renal function has recovered, so contrast with angiogram should be low risk - cont with oxygen supplementation  - daughter interested in medical intervention if there is a change of improvement. However, patient is high risk for surgery  - trauma service following  - PCCM is available with question, she was seen by Dr Vassie Loll last night - keep in SDU for close monitoring  - consider palliative consult if her clinical status continues to deteriorate.   # Tachycardia: likely driven by tachypnea as a result of peritonism. Repeat EKG on 12/24 evening with short PR interval of 90 which points  towards possibility of pre-excitation syndrome.  Plan  - IV metoprol 2.5 mg as needed to keep HR <120 - troponin neg X3  - BP stable   # AKI: has improved from Cr of 2.5 on presentation to 0.68 today. AKI was likely secondary to prerenal with hypotension.   # Hyponatremia- resolved after rehydration. Monitor daily with BMPs  #Dyslipidemia- Last checked 5 months ago- LDL- 108, total Chol- 194, TG- 233. Home med list do not include any statins.   COPD- Stable. Home meds- Albuterol inhalers  Q6H.  - Cont with Albuterol inahlers   DM- Home meds- Glipizide 10mg  daily. CBGs- AC, QHS  - cont with SSI  CAD- drug-eluting stent to the proximal LAD. She had late in-stent thrombosis in January 2009. The patient also had multiple drug-eluting stents in right coronary artery as well as circumflex coronary artery. Her last cardiac catheterization was in May of 2010 and demonstrated 50-70% distal circumflex stenosis, patent has LAD stents and 50% distal RCA in-stent restenosis. Ejection fraction is 60%.   Plan  - hold anticoagulants.  - keep hbg >8  - ekg with chest pain as needed  Dementia with intermittent delirium- Patient is not totally independent, lives at [facility] center at [place]. Nursing staff reported that patinet is confused and pulling out IV lines. Consider Delirium Vs Dementia.  Plan  - keep bedside sitter - Pt was given 0.5mg  of haldol, and the Q8H PRN.   DVT PPX- SCDS    CODE STATUS- DNR, Daughter- at bedside who is the [facility], who confirmed pt is DNR.    Dispo: Disposition is deferred at this time, awaiting improvement of current medical problems.  The patient does have a current PCP Richardean Chimera, MD) and does need an [facility] hospital follow-up appointment after discharge.  The patient does not know have transportation limitations that hinder transportation to clinic appointments.    Length of Stay: 2 days   Signed by:  Dow Adolph, MD PGY-I, Internal  Medicine Pager [phone] 12/19/2013, 12:30 PM

## 2013-12-19 NOTE — Progress Notes (Signed)
[place]   Spoke to daughter Randa Evens update her on input from IR since we spoke earlier.  [id number] is her cell # and she needs to be updated by primary team in the am if she is not present  Shirlee Latch MD

## 2013-12-19 NOTE — Progress Notes (Signed)
Internal Medicine Attending  Date: 12/19/2013  Patient name: [name] Medical record number: [id number] Date of birth: Mar 10, 1933 Age: 79 y.o. Gender: female  I saw and evaluated the patient on a.m. rounds, and discussed her care with house staff. I reviewed the resident's note by Dr. Zada Girt and I agree with the resident's findings and plans as documented in his note, including plans for CT angiogram of the abdomen and pelvis as advised by IR to determine whether patient has ongoing bleeding, with a view toward IR consult for intervention if indicated.

## 2013-12-20 ENCOUNTER — Inpatient Hospital Stay (HOSPITAL_COMMUNITY): Payer: Medicare Other

## 2013-12-20 DIAGNOSIS — Z515 Encounter for palliative care: Secondary | ICD-10-CM

## 2013-12-20 DIAGNOSIS — IMO0002 Reserved for concepts with insufficient information to code with codable children: Secondary | ICD-10-CM

## 2013-12-20 DIAGNOSIS — R0609 Other forms of dyspnea: Secondary | ICD-10-CM

## 2013-12-20 LAB — COMPREHENSIVE METABOLIC PANEL
AST: 10 U/L (ref 0–37)
Albumin: 2.6 g/dL — ABNORMAL LOW (ref 3.5–5.2)
CO2: 35 mEq/L — ABNORMAL HIGH (ref 19–32)
Calcium: 9.9 mg/dL (ref 8.4–10.5)
Creatinine, Ser: 0.51 mg/dL (ref 0.50–1.10)
GFR calc Af Amer: >90 mL/min (ref >90)
GFR calc non Af Amer: 90 mL/min — ABNORMAL LOW (ref >90)
Glucose, Bld: 194 mg/dL — ABNORMAL HIGH (ref 70–99)

## 2013-12-20 LAB — CBC
Hemoglobin: 9.1 g/dL — ABNORMAL LOW (ref 12.0–15.0)
MCH: 28.3 pg (ref 26.0–34.0)
MCHC: 32.4 g/dL (ref 30.0–36.0)
MCV: 87.3 fL (ref 78.0–100.0)
Platelets: 264 10*3/uL (ref 150–400)
RBC: 3.22 MIL/uL — ABNORMAL LOW (ref 3.87–5.11)
RDW: 15.1 % (ref 11.5–15.5)

## 2013-12-20 LAB — BODY FLUID CELL COUNT WITH DIFFERENTIAL: Total Nucleated Cell Count, Fluid: 4935 cu mm — ABNORMAL HIGH (ref 0–1000)

## 2013-12-20 LAB — ALBUMIN, FLUID (OTHER)

## 2013-12-20 MED ORDER — LORAZEPAM 2 MG/ML IJ SOLN
1.0000 mg | INTRAMUSCULAR | Status: DC | PRN
  Administered 2013-12-20 – 2013-12-21 (×3): 1 mg via INTRAVENOUS
  Filled 2013-12-20 (×3): qty 1

## 2013-12-20 MED ORDER — MORPHINE SULFATE 2 MG/ML IJ SOLN
2.0000 mg | INTRAMUSCULAR | Status: DC | PRN
  Administered 2013-12-20 – 2013-12-21 (×6): 2 mg via INTRAVENOUS
  Filled 2013-12-20 (×6): qty 1

## 2013-12-20 MED ORDER — MORPHINE SULFATE 2 MG/ML IJ SOLN
2.0000 mg | INTRAMUSCULAR | Status: DC | PRN
  Administered 2013-12-20: 2 mg via INTRAVENOUS
  Filled 2013-12-20: qty 1

## 2013-12-20 NOTE — Progress Notes (Signed)
Chaplain responded to spiritual care consult and offered emotional/spiritual care and anticipatory grief support to pt's family. Family requested prayer. Pt's daughter, sister, and niece very very emotional. Chaplain provided prayer, emotional/spiritual/grief support, empathic listening, and caring presence. Please lage if requested, but will follow up.   Guy Sandifer [place], [place] [phone]

## 2013-12-20 NOTE — Consult Note (Signed)
Patient [name]:EAVWUJ DEARRA [name]      DOB: 1933/06/24      WJX:[id number]     Consult Note from the Palliative Medicine Team at [facility]    Consult Requested by: dr Meredith Pel     PCP: Donzetta Sprung, MD Reason for Consultation:Clarifcatioj of GOC and options     Phone Number:[phone]  Assessment of patients Current state:Patient is a 79 year old woman with history of dementia, COPD, dyslipidemia, hypertension, coronary artery disease,  who presented to [facility] following a recent fall, was found to be anemic and hypotensive; abdominal CT scan there showed a large acute hematoma at the left side of the upper pelvis, and the patient was transferred to [facility] for further management.  Paracentesis today yielded 270 cc bloody ascitic fluid.  Patient continues to decline, overall poor prognosis, family wish for comfort care approach.    This NP Lorinda Creed reviewed medical records, received report from team, assessed the patient and then meet at the patient's bedside along with her son [name] # [id number]  and daughter  [name] # [phone], H# [phone]   to discuss diagnosis prognosis, GOC, EOL wishes disposition and options.   A detailed discussion was had today regarding advanced directives.  Concepts specific to code status, artifical feeding and hydration, continued IV antibiotics and rehospitalization was had.  The difference between a aggressive medical intervention path  and a palliative comfort care path for this patient at this time was had.  Values and goals of care important to patient and family were attempted to be elicited.  Concept of Hospice and Palliative Care were discussed  Natural trajectory and expectations at EOL were discussed.  Questions and concerns addressed.  Hard Choices booklet left for review. Family encouraged to call with questions or concerns.  PMT will continue to support holistically.       Goals of Care: 1.  Code  Status:DNR/DNI   2. Scope of Treatment: 1. Vital Signs: daily  2. Respiratory/Oxygen:for comfort only 3. Nutritional Support/Tube Feeds:no artificail feeding now or in the fututre 4. Antibiotics:none 5. Blood Products:none 6. IVF:KVO for meds only 7. Review of Medications to be discontinued:minimze for comfort  8. Labs:none 9. Telemetry:none 10. Consults:none   4. Disposition:  Transition to med-surg unit ([facility]).  Prognosis is likely hrs to days   3. Symptom Management:   1. Failure to thrive/ Weakness:  2. Anxiety/Agitation: Ativan 1 mg IV every 4 hrs prn 3. Pain/Dyspnea: Morphine 2 mg every 1 hr IV prn  4. Psychosocial:  Emotional support to family at bedside.  They understand the limited prognosis and hope for comfort and dignity  5. Spiritual:  Chaplain consulted   Patient Documents Completed or Given: Document Given Completed  Advanced Directives Pkt    MOST yes   DNR    Gone from My Sight    Hard Choices yes     Brief HPI: Patient is a 79 year old woman with history of dementia, COPD, dyslipidemia, hypertension, coronary artery disease,  who presented to [facility] following a recent fall, was found to be anemic and hypotensive; abdominal CT scan there showed a large acute hematoma at the left side of the upper pelvis, and the patient was transferred to [facility] for further management.  Paracentesis today yielded 270 cc bloody ascitic fluid.  Patient continues to decline, overall poor prognosis, family wish for comfort care approach.     YQI:HKVQQV to illicit due to altered mental status  PMH:  Past Medical History  Diagnosis Date  . Coronary artery disease     multivessel  . Hypotension   . Peripheral artery disease   . Hypertension     hx  . Diabetes mellitus, type 2   . Dyslipidemia   . Tobacco user     remote  . Stroke   . COPD (chronic obstructive pulmonary disease)   . Dementia      ZOX:WRUEAVW reviewed. No pertinent past  surgical history. I have reviewed the FH and SH and  If appropriate update it with new information. Allergies  Allergen Reactions  . Atorvastatin     REACTION: myalgia  . Other     Reductase Inhibitors   . Penicillins     REACTION: rash   Scheduled Meds: . citalopram  20 mg Oral Daily  . sodium chloride  500 mL Intravenous Once  . valproate sodium  250 mg Intravenous Q12H   Continuous Infusions: . sodium chloride 75 mL/hr at 12/19/13 2221   PRN Meds:.albuterol, bisacodyl, haloperidol, metoprolol, morphine injection    BP 120/79  Pulse 105  Temp(Src) 98 F (36.7 C) (Oral)  Resp 22  Ht 5\' 3"  (1.6 m)  Wt 56.9 kg (125 lb 7.1 oz)  BMI 22.23 kg/m2  SpO2 100%   PPS:20 %   Intake/Output Summary (Last 24 hours) at 12/20/13 1024 Last data filed at 12/20/13 0749  Gross per 24 hour  Intake      0 ml  Output   1100 ml  Net  -1100 ml    Physical Exam:  General: ill appearing HEENT:  Dry buccal membranes, no exudate Chest:  Diminished throughout, scattered coarse BS CVS: tachcyacrdic Abdomen: soft, tender with guarding on palpation      decreased BS Ext: wtihout edema Neuro: confused, unable to follow commands  Labs: CBC    Component Value Date/Time   WBC 10.0 12/20/2013 0545   RBC 3.22* 12/20/2013 0545   HGB 9.1* 12/20/2013 0545   HCT 28.1* 12/20/2013 0545   PLT 264 12/20/2013 0545   MCV 87.3 12/20/2013 0545   MCH 28.3 12/20/2013 0545   MCHC 32.4 12/20/2013 0545   RDW 15.1 12/20/2013 0545   LYMPHSABS 1.7 12/18/2013 0320   MONOABS 1.9* 12/18/2013 0320   EOSABS 0.1 12/18/2013 0320   BASOSABS 0.0 12/18/2013 0320    BMET    Component Value Date/Time   NA 140 12/20/2013 0545   K 4.9 12/20/2013 0545   CL 101 12/20/2013 0545   CO2 35* 12/20/2013 0545   GLUCOSE 194* 12/20/2013 0545   BUN 15 12/20/2013 0545   CREATININE 0.51 12/20/2013 0545   CALCIUM 9.9 12/20/2013 0545   GFRNONAA 90* 12/20/2013 0545   GFRAA >90 12/20/2013 0545    CMP     Component  Value Date/Time   NA 140 12/20/2013 0545   K 4.9 12/20/2013 0545   CL 101 12/20/2013 0545   CO2 35* 12/20/2013 0545   GLUCOSE 194* 12/20/2013 0545   BUN 15 12/20/2013 0545   CREATININE 0.51 12/20/2013 0545   CALCIUM 9.9 12/20/2013 0545   PROT 6.2 12/20/2013 0545   ALBUMIN 2.6* 12/20/2013 0545   AST 10 12/20/2013 0545   ALT 5 12/20/2013 0545   ALKPHOS 48 12/20/2013 0545   BILITOT 0.7 12/20/2013 0545   GFRNONAA 90* 12/20/2013 0545   GFRAA >90 12/20/2013 0545   12/24 CT abd -Large acute hematoma at the left side of the upper pelvis, measuring 12.8 x 4.3 x  11.5 cm. This may be intraperitoneal or retroperitoneal in nature. Moderate amount of free fluid noted throughout the abdomen and pelvis, of relatively low attenuation, though slightly increased attenuation is seen about the liver. This  may reflect either serosanguineous fluid or underlying relatively chronic extension of blood from the hematoma into the abdomen and pelvis.     Time In Time Out Total Time Spent with Patient Total Overall Time  1015 1145 80 min 90 min    Greater than 50%  of this time was spent counseling and coordinating care related to the above assessment and plan.  Lorinda Creed NP  Palliative Medicine Team Team Phone # [phone] Pager [phone]  Discussed with Medical resident

## 2013-12-20 NOTE — Progress Notes (Signed)
   Name: JUNELLE HASHEMI MRN: [id number] DOB: [birth year]    ADMISSION DATE:  01/11/14 CONSULTATION DATE:  12/18/13  REFERRING MD :  Danise Edge PRIMARY SERVICE:  [facility]  Family at bedside. Nothing from PCCM stand-point to offer. We will s/o  Anders Simmonds ACNP-BC [facility] Pulmonary/Critical Care Pager # [phone] OR # [phone] if no answer

## 2013-12-20 NOTE — Progress Notes (Signed)
Patient ID: [name], female   DOB: [birth year], 79 y.o.   MRN: [id number]    Subjective: Resting, does not offer complaint  Objective: Vital signs in last 24 hours: Temp:  [97.9 F (36.6 C)-98.6 F (37 C)] 97.9 F (36.6 C) (12/26 0442) Pulse Rate:  [105-149] 105 (12/26 0442) Resp:  [21-29] 29 (12/26 0442) BP: (131-156)/(62-98) 135/68 mmHg (12/26 0442) SpO2:  [94 %-100 %] 100 % (12/26 0442) Weight:  [125 lb 7.1 oz (56.9 kg)] 125 lb 7.1 oz (56.9 kg) (12/26 0500) Last BM Date: 12/18/13  Intake/Output from previous day: 12/25 0701 - 12/26 0700 In: -  Out: 800 [Urine:800] Intake/Output this shift:    General appearance: no distress Resp: clear to auscultation bilaterally Cardio: regular rate and rhythm GI: soft, NT  Lab Results: CBC   Recent Labs  12/19/13 1856 12/20/13 0545  WBC 14.4* 10.0  HGB 9.6* 9.1*  HCT 30.6* 28.1*  PLT 268 264   BMET  Recent Labs  12/19/13 0316 12/20/13 0545  NA 137 140  K 4.7 4.9  CL 100 101  CO2 33* 35*  GLUCOSE 190* 194*  BUN 15 15  CREATININE 0.68 0.51  CALCIUM 9.4 9.9   PT/INR  Recent Labs  12/20/2013 2316 12/18/13 2222  LABPROT 15.4* 14.4  INR 1.25 1.14   ABG No results found for this basename: PHART, PCO2, PO2, HCO3,  in the last 72 hours  Studies/Results: Dg Chest Port 1 View  12/18/2013   CLINICAL DATA:  Difficulty breathing  EXAM: PORTABLE CHEST - 1 VIEW  COMPARISON:  12/18/2013  FINDINGS: Rotated exam to the right. Skin folds overlie the right hemithorax. Normal heart size and vascularity. Grossly clear lungs without focal pneumonia, collapse or consolidation. No pleural fluid or pneumothorax. Stable exam.  IMPRESSION: Stable exam.  No superimposed acute process   Electronically Signed   By: Ruel Favors M.D.   On: 12/18/2013 19:48   Portable Chest 1 View  12/18/2013   CLINICAL DATA:  Wheezing  EXAM: PORTABLE CHEST - 1 VIEW  COMPARISON:  07/01/2013  FINDINGS: The cardiac shadow is within normal limits. The  lungs are well aerated bilaterally without focal infiltrate or sizable effusion. Persistent mild interstitial changes are identified without focal infiltrate.  IMPRESSION: Chronic changes without acute abnormality.   Electronically Signed   By: Alcide Clever M.D.   On: 12/18/2013 10:42   Ct Angio Abd/pel W/ And/or W/o  12/19/2013   CLINICAL DATA:  Anemia and left pelvic hematoma. CTA assessment for possible vascular etiology.  EXAM: CT ANGIOGRAPHY ABDOMEN AND PELVIS WITH CONTRAST AND WITHOUT CONTRAST  TECHNIQUE: Multidetector CT imaging of the abdomen and pelvis was performed using the standard protocol during bolus administration of intravenous contrast. Multiplanar reconstructed images including MIPs were obtained and reviewed to evaluate the vascular anatomy.  CONTRAST:  OMNIPAQUE IOHEXOL 350 MG/ML SOLN  COMPARISON:  Unenhanced CT of the abdomen and pelvis on 12/18/2013.  FINDINGS: Size of the area of mixed density in the left pelvis is stable, measuring just under 13 cm in greatest diameter. During arterial phase imaging, no extravasation of contrast material or abnormal blood vessel is identified in this region. The soft tissue abnormality continues to demonstrate irregular mixed attenuation. A moderate amount of lower density ascites is also again identified throughout the peritoneal cavity.  The left pelvic abnormality is somewhat atypical in appearance for a simple hematoma and needs to be followed/ further assessed. There are some amorphous bowel loops that extend  through this region and the presence of ascites and stranding in the mesentery may also be suggestive of an underlying neoplastic process. The dominant abnormality may be a bleeding tumor rather than a simple spontaneous bleed.  Vascular assessment demonstrates diffuse atherosclerotic calcification of the abdominal aorta and iliac arteries. Major branch vessels show no significant stenosis of the celiac axis, superior mesenteric artery,  renal arteries or inferior mesenteric artery. Diffuse calcification of the iliac arteries present with diffuse disease present but no significant occlusion or evidence of aneurysm. The common femoral arteries are diffusely calcified and diseased bilaterally.  No visible overtly enlarged lymph nodes are identified. There is no evidence of bowel obstruction or perforation. The liver shows no overt cirrhotic changes. The spleen shows irregular enhancement, which may be a normal perfusion pattern in the arterial phase. Both kidneys are unremarkable. No obvious pancreatic lesions are seen. There is somewhat limited evaluation of abdominal contents as the arms and hands overly the body, creating streak artifact. Bony structures show osteopenia without evidence of lesion or fracture.  The visualized lung bases show a small right pleural effusion with right basilar atelectasis. The bladder is decompressed by a Foley catheter. No hernias are seen. The uterus appears to have been removed.  Review of the MIP images confirms the above findings.  IMPRESSION: No arterial contrast extravasation or abnormal blood vessel is identified in the region of the left pelvic abnormality. Based on its unusual mixed density appearance as well as a moderate amount of ascites in the peritoneal cavity, an underlying neoplastic process cannot be excluded. When better able, there may be a benefit in performing a followup CT of the abdomen and pelvis with oral contrast and during a venous phase of imaging.   Electronically Signed   By: Irish Lack M.D.   On: 12/19/2013 18:21    Anti-infectives: Anti-infectives   None      Assessment/Plan: Pelvic retroperitoneal bleed - CTA reviewed. No active bleeding. This is possibly a large tumor that had internal hemorrhage.  If that is the case, she is not a candidate for surgery to remove it - it is very large. Recommend advancing her diet, decrease frequency of lab draws, and plan return to NH.  Further work-up with another CT or MR can be done later as an outpatient but doubt that would change the plan in light of her dementia. I spoke with her son.   LOS: 3 days    Violeta Gelinas, MD, MPH, FACS Pager: [phone]  12/20/2013

## 2013-12-20 NOTE — Progress Notes (Signed)
INTERNAL MEDICINE TEACHING SERVICE Night Float Progress Note   Subjective:    We were called overnight by the RN for evaluation of decline in mental status as well as tachycardia and tachypnea. Earlier in the evening, patient was complaining of abdominal pain, given Morphine 2 mg. Patient has had recent history of tachycardia, as high as 140's over the last 1-2 days for short intervals, thought to be possibly 2/2 intermittent SVT. At time of evaluation, pt was tachycardic to the 140's, tachypneic to high-20's, and only responsive to painful stimuli on exam. Abdomen soft, tender to palpation.    Objective:    BP 131/62  Pulse 106  Temp(Src) 98.6 F (37 C) (Axillary)  Resp 27  Ht 5\' 3"  (1.6 m)  Wt 134 lb 11.2 oz (61.1 kg)  BMI 23.87 kg/m2  SpO2 99%   Labs: Basic Metabolic Panel:    Component Value Date/Time   NA 137 12/19/2013 0316   K 4.7 12/19/2013 0316   CL 100 12/19/2013 0316   CO2 33* 12/19/2013 0316   BUN 15 12/19/2013 0316   CREATININE 0.68 12/19/2013 0316   GLUCOSE 190* 12/19/2013 0316   CALCIUM 9.4 12/19/2013 0316    CBC:    Component Value Date/Time   WBC 14.4* 12/19/2013 1856   HGB 9.6* 12/19/2013 1856   HCT 30.6* 12/19/2013 1856   PLT 268 12/19/2013 1856   MCV 87.7 12/19/2013 1856   NEUTROABS 8.7* 12/18/2013 0320   LYMPHSABS 1.7 12/18/2013 0320   MONOABS 1.9* 12/18/2013 0320   EOSABS 0.1 12/18/2013 0320   BASOSABS 0.0 12/18/2013 0320    Cardiac Enzymes: Lab Results  Component Value Date   CKTOTAL 856* 12/19/2008   CKMB 45.5* 12/19/2008   TROPONINI <0.30 12/18/2013    Physical Exam: General: Vital signs reviewed and noted. Elderly female, only responsive to painful stimuli, in mild distress.  Lungs:  Tachypneic on exam. Clear to auscultation BL without crackles or wheezes.  Heart: Tachycardic to the 140's on exam, regular rhythm. S1 and S2 normal without gallop, murmur, or rubs.  Abdomen:  BS normoactive. Soft, distended, tender to palpation  diffusely.  No masses or organomegaly.  Extremities: No pretibial edema.     Assessment/ Plan:    [name] is a 79 y.o. female w/ PMHx of Dementia, COPD, HLD, HTN, CAD, admitted for acute blood loss anemia s/p fall w/ possible retroperitoneal hematoma on abdominal CT.  -Given physical findings on exam, discussed at length with Dr. Meredith Pel. Feel that episodes of tachypnea and tachycardia are unclear at this time. Hb continues to be stable.  -Tachycardia now controlled w/ IV metoprolol (100's-110's) and morphine for pain control. -Tachypnea improved w/ HR -Patient had CTA abdomen today, discussed w/ Dr. Fredia Sorrow, some question as to possible mass vs hematoma at this time. Also showed no arterial contrast extravasation or abnormal blood vessel identified in the region of the left pelvic abnormality.Will perform US-guided paracentesis in AM. Cytology, albumin, cell counts, gram stain, and culture of fluid pending. -Seen by Dr. Vassie Loll w/ PCCM, agrees w/ paracentesis. Will continue to follow.  Courtney Paris, MD  12/20/2013, 12:31 AM

## 2013-12-20 NOTE — Progress Notes (Signed)
Nutrition Brief Note  Patient identified on the Malnutrition Screening Tool Report. Patient transitioned to comfort care measures.  No nutrition interventions warranted at this time.  Please consult as needed.   Maureen Chatters, RD, LDN Pager #: [phone] After-Hours Pager #: [phone]

## 2013-12-20 NOTE — Progress Notes (Signed)
Arrived to room 6n01, family at bedside, pt unresponsive.

## 2013-12-20 NOTE — Progress Notes (Signed)
Internal Medicine Attending  Date: 12/20/2013  Patient name: [name] Medical record number: [id number] Date of birth: [birth year] Age: 79 y.o. Gender: female  I saw and evaluated the patient and discussed her care with house staff. I reviewed the resident's note by Dr. Mariea Clonts and I agree with the resident's findings and plans as documented in her note.

## 2013-12-20 NOTE — Progress Notes (Signed)
Subjective: Lying in bed. Appears to be having some difficulty breathing, but saturating 100% on 3L of O2. Son at bedside, says sister will be coming to the hospital soon.  Objective: Vital signs in last 24 hours: Filed Vitals:   12/20/13 0442 12/20/13 0500 12/20/13 0748 12/20/13 1200  BP: 135/68  120/79   Pulse: 105  105   Temp: 97.9 F (36.6 C)  98 F (36.7 C) 96.9 F (36.1 C)  TempSrc: Axillary  Oral Axillary  Resp: 29  22   Height:      Weight:  125 lb 7.1 oz (56.9 kg)    SpO2: 100%  100%    Weight change: -4 lb 15.3 oz (-2.249 kg)  Intake/Output Summary (Last 24 hours) at 12/20/13 1338 Last data filed at 12/20/13 1200  Gross per 24 hour  Intake    300 ml  Output   1250 ml  Net   -950 ml   Physical Exam-  General: Appears to be in moderate respiratory distress. Arousable but very drowsy. Son at bedside.  Lungs: No crackles or wheezes on auscultation, Increased respiratory effort. Heart: Tachycardia. S1 and S2 normal without gallop, murmur, or rubs.  Abdomen: Distended. Marked tenderness to palpation, reduced bowel sounds.  Extremities: No pretibial edema.  Lab Results: Basic Metabolic Panel:  Recent Labs Lab 12/05/2013 2316 12/18/13 1103  12/19/13 0316 12/20/13 0545  NA 129*  --   < > 137 140  K 4.1  --   < > 4.7 4.9  CL 89*  --   < > 100 101  CO2 30  --   < > 33* 35*  GLUCOSE 206*  --   < > 190* 194*  BUN 31*  --   < > 15 15  CREATININE 2.50*  --   < > 0.68 0.51  CALCIUM 9.7  --   < > 9.4 9.9  MG  --  1.9  --   --   --   PHOS  --  2.6  --   --   --   < > = values in this interval not displayed. Liver Function Tests:  Recent Labs Lab 12/19/13 0316 12/20/13 0545  AST 15 10  ALT 6 5  ALKPHOS 45 48  BILITOT 0.8 0.7  PROT 6.4 6.2  ALBUMIN 2.9* 2.6*   CBC:  Recent Labs Lab 11/30/2013 2258  12/18/13 0320  12/19/13 1856 12/20/13 0545  WBC 10.6*  < > 12.3*  < > 14.4* 10.0  NEUTROABS 7.9*  --  8.7*  --   --   --   HGB 5.7*  < > 9.1*  < > 9.6*  9.1*  HCT 17.7*  < > 27.1*  < > 30.6* 28.1*  MCV 80.5  < > 84.2  < > 87.7 87.3  PLT 236  < > 210  < > 268 264  < > = values in this interval not displayed. Cardiac Enzymes:  Recent Labs Lab 12/18/13 1103 12/18/13 1700 12/18/13 2212  TROPONINI <0.30 <0.30 <0.30   CBG:  Recent Labs Lab 12/18/13 1126 12/18/13 1619  GLUCAP 116* 121*   Thyroid Function Tests:  Recent Labs Lab 12/19/13 1856  TSH 0.589   Coagulation:  Recent Labs Lab 12/14/2013 2316 12/18/13 2222  LABPROT 15.4* 14.4  INR 1.25 1.14    Urinalysis:  Recent Labs Lab 12/18/13 0003  COLORURINE YELLOW  LABSPEC 1.025  PHURINE 5.0  GLUCOSEU NEGATIVE  HGBUR NEGATIVE  BILIRUBINUR SMALL*  Lavenia Atlas  NEGATIVE  PROTEINUR NEGATIVE  UROBILINOGEN 0.2  NITRITE NEGATIVE  LEUKOCYTESUR NEGATIVE    Micro Results: Recent Results (from the past 240 hour(s))  URINE CULTURE     Status: None   Collection Time    12/18/13 12:03 AM      Result Value Range Status   Specimen Description URINE, CATHETERIZED   Final   Special Requests NONE   Final   Culture  Setup Time     Final   Value: 12/18/2013 00:25     Performed at [facility] Count     Final   Value: NO GROWTH     Performed at [facility]   Culture     Final   Value: NO GROWTH     Performed at [facility]   Report Status 12/19/2013 FINAL   Final  MRSA PCR SCREENING     Status: None   Collection Time    12/18/13 10:20 PM      Result Value Range Status   MRSA by PCR NEGATIVE  NEGATIVE Final   Comment:            The GeneXpert MRSA Assay (FDA     approved for NASAL specimens     only), is one component of a     comprehensive MRSA colonization     surveillance program. It is not     intended to diagnose MRSA     infection nor to guide or     monitor treatment for     MRSA infections.  CULTURE, BLOOD (ROUTINE X 2)     Status: None   Collection Time    12/19/13  3:05 AM      Result Value Range Status   Specimen  Description BLOOD LEFT HAND   Final   Special Requests BOTTLES DRAWN AEROBIC ONLY 5CC   Final   Culture  Setup Time     Final   Value: 12/19/2013 10:00     Performed at [facility]   Culture     Final   Value:        BLOOD CULTURE RECEIVED NO GROWTH TO DATE CULTURE WILL BE HELD FOR 5 DAYS BEFORE ISSUING A FINAL NEGATIVE REPORT     Performed at [facility]   Report Status PENDING   Incomplete  CULTURE, BLOOD (ROUTINE X 2)     Status: None   Collection Time    12/19/13  3:16 AM      Result Value Range Status   Specimen Description BLOOD RIGHT ARM   Final   Special Requests BOTTLES DRAWN AEROBIC ONLY 10CC   Final   Culture  Setup Time     Final   Value: 12/19/2013 10:00     Performed at [facility]   Culture     Final   Value:        BLOOD CULTURE RECEIVED NO GROWTH TO DATE CULTURE WILL BE HELD FOR 5 DAYS BEFORE ISSUING A FINAL NEGATIVE REPORT     Performed at [facility]   Report Status PENDING   Incomplete   Studies/Results: US Paracentesis  12/20/2013   CLINICAL DATA:  Abdominal mass with probable hemorrhage, ascites. Request diagnostic paracentesis.  EXAM: ULTRASOUND GUIDED PARACENTESIS  COMPARISON:  None.  PROCEDURE: An ultrasound guided paracentesis was thoroughly discussed with the patient and questions answered. The benefits, risks, alternatives and complications were also discussed. The patient understands and wishes to proceed  with the procedure. Written consent was obtained.  Ultrasound was performed to localize and mark an adequate pocket of fluid in the right lower quadrant of the abdomen. The area was then prepped and draped in the normal sterile fashion. 1% Lidocaine was used for local anesthesia. Under ultrasound guidance a 19 gauge Yueh catheter was introduced. Paracentesis was performed. The catheter was removed and a dressing applied.  COMPLICATIONS: None immediate  FINDINGS: A total of approximately 270 mL of bloody ascitic fluid was  removed. A fluid sample was sent for laboratory analysis.  IMPRESSION: Successful ultrasound guided paracentesis yielding 270 mL of ascites.  Read by: Brayton El PA-C   Electronically Signed   By: Maryclare Bean M.D.   On: 12/20/2013 10:16   Dg Chest Port 1 View  12/18/2013   CLINICAL DATA:  Difficulty breathing  EXAM: PORTABLE CHEST - 1 VIEW  COMPARISON:  12/18/2013  FINDINGS: Rotated exam to the right. Skin folds overlie the right hemithorax. Normal heart size and vascularity. Grossly clear lungs without focal pneumonia, collapse or consolidation. No pleural fluid or pneumothorax. Stable exam.  IMPRESSION: Stable exam.  No superimposed acute process   Electronically Signed   By: Ruel Favors M.D.   On: 12/18/2013 19:48   Ct Angio Abd/pel W/ And/or W/o  12/19/2013   CLINICAL DATA:  Anemia and left pelvic hematoma. CTA assessment for possible vascular etiology.  EXAM: CT ANGIOGRAPHY ABDOMEN AND PELVIS WITH CONTRAST AND WITHOUT CONTRAST  TECHNIQUE: Multidetector CT imaging of the abdomen and pelvis was performed using the standard protocol during bolus administration of intravenous contrast. Multiplanar reconstructed images including MIPs were obtained and reviewed to evaluate the vascular anatomy.  CONTRAST:  OMNIPAQUE IOHEXOL 350 MG/ML SOLN  COMPARISON:  Unenhanced CT of the abdomen and pelvis on 12/18/2013.  FINDINGS: Size of the area of mixed density in the left pelvis is stable, measuring just under 13 cm in greatest diameter. During arterial phase imaging, no extravasation of contrast material or abnormal blood vessel is identified in this region. The soft tissue abnormality continues to demonstrate irregular mixed attenuation. A moderate amount of lower density ascites is also again identified throughout the peritoneal cavity.  The left pelvic abnormality is somewhat atypical in appearance for a simple hematoma and needs to be followed/ further assessed. There are some amorphous bowel loops that  extend through this region and the presence of ascites and stranding in the mesentery may also be suggestive of an underlying neoplastic process. The dominant abnormality may be a bleeding tumor rather than a simple spontaneous bleed.  Vascular assessment demonstrates diffuse atherosclerotic calcification of the abdominal aorta and iliac arteries. Major branch vessels show no significant stenosis of the celiac axis, superior mesenteric artery, renal arteries or inferior mesenteric artery. Diffuse calcification of the iliac arteries present with diffuse disease present but no significant occlusion or evidence of aneurysm. The common femoral arteries are diffusely calcified and diseased bilaterally.  No visible overtly enlarged lymph nodes are identified. There is no evidence of bowel obstruction or perforation. The liver shows no overt cirrhotic changes. The spleen shows irregular enhancement, which may be a normal perfusion pattern in the arterial phase. Both kidneys are unremarkable. No obvious pancreatic lesions are seen. There is somewhat limited evaluation of abdominal contents as the arms and hands overly the body, creating streak artifact. Bony structures show osteopenia without evidence of lesion or fracture.  The visualized lung bases show a small right pleural effusion with right basilar atelectasis. The  bladder is decompressed by a Foley catheter. No hernias are seen. The uterus appears to have been removed.  Review of the MIP images confirms the above findings.  IMPRESSION: No arterial contrast extravasation or abnormal blood vessel is identified in the region of the left pelvic abnormality. Based on its unusual mixed density appearance as well as a moderate amount of ascites in the peritoneal cavity, an underlying neoplastic process cannot be excluded. When better able, there may be a benefit in performing a followup CT of the abdomen and pelvis with oral contrast and during a venous phase of imaging.    Electronically Signed   By: Irish Lack M.D.   On: 12/19/2013 18:21   Medications: I have reviewed the patient's current medications. Scheduled Meds: . sodium chloride  500 mL Intravenous Once  . valproate sodium  250 mg Intravenous Q12H   Continuous Infusions: . sodium chloride 20 mL/hr at 12/20/13 1149   PRN Meds:.albuterol, bisacodyl, haloperidol, LORazepam, morphine injection Assessment/Plan:  # Acute blood loss Anemia- CTA abdomen was done yesterday- Revealed-an unusual mixed density appearance, as well as a moderate amount of ascite in the peritoneal cavity, an underlying neoplastic process cannot be excluded. Initial abd/pelvis Ct findings-12/18/2013 in the abdomen with a large acute intraabd bleed-?intra or extra abd hematoma. Patient was on Aspirin and Clopidogrel for CAD prior to fall. Hgb has been stable- 9.1- 10.4 throughout admission, blood pressure stable. Exam with signs of peritoneal irritation likely cause of her dyspnea. Ultra sound guided Paracentesis was done today with drainage of of bloody ascitic fliud. - Palliative consulted today, patient is full comfort care. - Cont with oxygen supplementation  - Trauma service following, recs appreciated. - Transfer to med-surg.   # Comfort care- Full comfort care. Palliative consulted, for goals of care.  Will follow palliative recommendations.  Dementia with intermittent delirium- Present for the past 3 years as per pts son.  DVT PPX- SCDS   Dispo: Disposition is deferred at this time, awaiting improvement of current medical problems.    The patient does have a current PCP Richardean Chimera, MD) and does not need an [facility] hospital follow-up appointment after discharge.  The patient does not know have transportation limitations that hinder transportation to clinic appointments.  .Services Needed at time of discharge: Y = Yes, Blank = No PT:   OT:   RN:   Equipment:   Other:     LOS: 3 days   Kennis Carina, MD 12/20/2013, 1:38 PM

## 2013-12-20 NOTE — Progress Notes (Signed)
Patient WU:[id number] O Sidener      DOB: Mar 06, 1933      YNW:[id number]  Received a call to the PMT phone from son requesting referral to [facility].  Dr. Meredith Pel talked with me about this option.  I have placed a social work consult to offer choice and make transition to [place].  Darrly Loberg L. Ladona Ridgel, MD MBA The Palliative Medicine Team at [facility] Phone: [phone] Pager: [phone]

## 2013-12-20 NOTE — Procedures (Signed)
Successful US guided paracentesis from RLQ.  Yielded of bloody ascitic fluid.  No immediate complications.  Pt tolerated well.   Specimen was sent for labs.  Brayton El PA-C 12/20/2013 10:14 AM

## 2013-12-21 MED ORDER — SCOPOLAMINE 1 MG/3DAYS TD PT72
1.0000 | MEDICATED_PATCH | TRANSDERMAL | Status: DC
  Filled 2013-12-21: qty 1

## 2013-12-21 MED ORDER — ATROPINE SULFATE 1 % OP SOLN: 4.0000 [drp] | OPHTHALMIC | Status: DC | PRN

## 2013-12-21 MED ORDER — DEXTROSE-NACL 5-0.45 % IV SOLN
INTRAVENOUS | Status: DC
  Administered 2013-12-21: 15:00:00 via INTRAVENOUS

## 2013-12-23 LAB — BODY FLUID CULTURE

## 2013-12-24 NOTE — Discharge Summary (Signed)
  Name: [name] MRN: [id number] DOB: [birth year] 79 y.o.  Date of Admission: 12/18/2013 10:30 PM Date of Discharge: Jan 16, 2014 Attending Physician: Margarito Liner, MD.  Discharge Diagnosis: Principal Problem:   Intra-abdominal Hemorrhage. Active Problems:   Acute blood loss anemia   DM   DYSLIPIDEMIA   Essential hypertension, benign   CAD   CAROTID ARTERY DISEASE   COPD   Dementia   Hypotension   Abdominal hematoma   Acute renal failure  Cause of death: Intra-abdominal hemorrhage.  Time of death: 15.34. 2014-01-16.  Hospital Course: Intra-abdominal hemorrhage with Acute blood loss Anemia- Patient presented to the hospital from a nursing home with hx of falls, with dropping hematocrit. Pt was also on plavix and aspirin. Hgb on presentation was 5.7, pt was transfused with 2 units of blood with appropriate response- post transfusion hgb-9.1. And was given Maintenance IVF- 52mls/hr after receiving a bolus of in the ED.  Abdominal Ct Wo contrast showed- Large acute hematoma at the left side of the upper pelvis, measuring 12.8 x 4.3 x 11.5 cm - intraperitoneal or retroperitoneal in nature. Head Ct Wo contrast was also done, impression was- No evidence of traumatic intracranial injury or fracture, with Scattered small vessel ischemic microangiopathy. Trauma and surgery were consulted and recommendation was for serial hemoglobin Q6H, not to start DVT prophylaxis, or cont with antiplatelet agents, but for supportive management as pt was a poor surgical candidate-Considering her hx of CAD, s/p multiple stents, COPD, DM and underlying Dementia, also because the retroperitoneum was not opened in cases of spontaneous hemorrhage due to release of tamponade. Patient became tachycardic-140s and tachypneic-high 20s, exact cause was not known as Hemoglobin remained stable- 9-10,  and blood pressures remained stable  but there was concern for increasing intrabdominal bleeding Vs Pulmonary  congestion. 12/18/2013-  Pt was transferred to the step down unit, EKG done revealed - sinus Tachycardia, Port chest Xray- No acute changes. PCCM was consulted, and recs were for supportive managemnt and to consider pelvic angiogram by IR, also to give Metoprolol 2.5-5mg  Q6H was given for tachycardia-which was given. 12/19/2013- A Ct angio abdomen and pelvis, recommended by Surgery, was done- showed- No arterial contrast extravasation or abnormal blood vessel identified in the region of the left pelvic abnormality. Based on its unusual mixed density appearance as well as a moderate amount of ascites in the peritoneal cavity, an underlying neoplastic process could not be excluded.  12/20/2013- Radiology was consulted in the light of these findings and an US guided paracentesis was done, which yielded of bloody Ascitic fluid, which was sent for cytology-which showed REACTIVE MESOTHELIAL CELLS AND INFLAMMATORY CELLS, gram stain and culture, were both negative. Palliative Care was consulted as patient clinical status began to deteriorate. On discussion with family and on considering pts wishes if she could make a decision, the family decided that no more aggressive interventions be done, no more investigations and and Full comfort care be instituted. Pt was then transferred from stepdown to 6N. The plan was to transfer pt to an in-patient hospice facility on the 01/17/2014, but patient passed away peacefully at 15.34, on the January 16, 2014 in the presence of her family.   Signed: Kennis Carina, MD 12/24/2013, 11:16 AM

## 2013-12-25 LAB — CULTURE, BLOOD (ROUTINE X 2)

## 2013-12-26 NOTE — Progress Notes (Signed)
Patient passed away at 1534. Patient's niece was at the bedside at time of death. MD notified as well as palliative care. [facility] contacted by charge rn. Chaplain called to provide spiritual care.

## 2013-12-26 NOTE — Progress Notes (Signed)
Chaplain responded to page from nurse concerning death of patient. Pt's niece, [name], was present and emotional. She was in the process of calling family members and informing them of the death. Chaplain provided emotional and spiritual support.

## 2013-12-26 NOTE — Progress Notes (Signed)
Subjective: Lying in bed, not responsive. Family not present.  Objective: Vital signs in last 24 hours: Filed Vitals:   12/20/13 1200 12/20/13 1825 12/20/13 2138 11/30/2013 0527  BP:  100/55 90/43 120/55  Pulse:  110 112 112  Temp: 96.9 F (36.1 C) 98.7 F (37.1 C) 98.7 F (37.1 C) 99.1 F (37.3 C)  TempSrc: Axillary Axillary Axillary Axillary  Resp:  21 20 20   Height:  5\' 3"  (1.6 m)    Weight:  121 lb 11.1 oz (55.2 kg)    SpO2:  91% 99% 100%   Weight change: -3 lb 12 oz (-1.7 kg)  Intake/Output Summary (Last 24 hours) at 12/20/2013 1235 Last data filed at 12/04/2013 1610  Gross per 24 hour  Intake  277.5 ml  Output    650 ml  Net -372.5 ml   Physical Exam-  General: Appears to be asleep but Unarousable,   Lungs: Some secretions in upper airway, No crackles or wheezes on auscultation, Increased respiratory effort. Heart: Tachycardia. S1 and S2 normal without gallop, murmur, or rubs.  Abdomen: No reaction on palpation of patients abdomen.  Extremities: No pretibial edema.  Lab Results: Basic Metabolic Panel:  Recent Labs Lab 12/04/2013 2316 12/18/13 1103  12/19/13 0316 12/20/13 0545  NA 129*  --   < > 137 140  K 4.1  --   < > 4.7 4.9  CL 89*  --   < > 100 101  CO2 30  --   < > 33* 35*  GLUCOSE 206*  --   < > 190* 194*  BUN 31*  --   < > 15 15  CREATININE 2.50*  --   < > 0.68 0.51  CALCIUM 9.7  --   < > 9.4 9.9  MG  --  1.9  --   --   --   PHOS  --  2.6  --   --   --   < > = values in this interval not displayed. Liver Function Tests:  Recent Labs Lab 12/19/13 0316 12/20/13 0545  AST 15 10  ALT 6 5  ALKPHOS 45 48  BILITOT 0.8 0.7  PROT 6.4 6.2  ALBUMIN 2.9* 2.6*   CBC:  Recent Labs Lab  2258  12/18/13 0320  12/19/13 1856 12/20/13 0545  WBC 10.6*  < > 12.3*  < > 14.4* 10.0  NEUTROABS 7.9*  --  8.7*  --   --   --   HGB 5.7*  < > 9.1*  < > 9.6* 9.1*  HCT 17.7*  < > 27.1*  < > 30.6* 28.1*  MCV 80.5  < > 84.2  < > 87.7 87.3  PLT 236  < >  210  < > 268 264  < > = values in this interval not displayed. Cardiac Enzymes:  Recent Labs Lab 12/18/13 1103 12/18/13 1700 12/18/13 2212  TROPONINI <0.30 <0.30 <0.30   CBG:  Recent Labs Lab 12/18/13 1126 12/18/13 1619  GLUCAP 116* 121*   Thyroid Function Tests:  Recent Labs Lab 12/19/13 1856  TSH 0.589   Coagulation:  Recent Labs Lab 11/28/2013 2316 12/18/13 2222  LABPROT 15.4* 14.4  INR 1.25 1.14    Urinalysis:  Recent Labs Lab 12/18/13 0003  COLORURINE YELLOW  LABSPEC 1.025  PHURINE 5.0  GLUCOSEU NEGATIVE  HGBUR NEGATIVE  BILIRUBINUR SMALL*  KETONESUR NEGATIVE  PROTEINUR NEGATIVE  UROBILINOGEN 0.2  NITRITE NEGATIVE  LEUKOCYTESUR NEGATIVE    Micro Results: Recent Results (from  the past 240 hour(s))  URINE CULTURE     Status: None   Collection Time    12/18/13 12:03 AM      Result Value Range Status   Specimen Description URINE, CATHETERIZED   Final   Special Requests NONE   Final   Culture  Setup Time     Final   Value: 12/18/2013 00:25     Performed at [facility] Count     Final   Value: NO GROWTH     Performed at [facility]   Culture     Final   Value: NO GROWTH     Performed at [facility]   Report Status 12/19/2013 FINAL   Final  MRSA PCR SCREENING     Status: None   Collection Time    12/18/13 10:20 PM      Result Value Range Status   MRSA by PCR NEGATIVE  NEGATIVE Final   Comment:            The GeneXpert MRSA Assay (FDA     approved for NASAL specimens     only), is one component of a     comprehensive MRSA colonization     surveillance program. It is not     intended to diagnose MRSA     infection nor to guide or     monitor treatment for     MRSA infections.  CULTURE, BLOOD (ROUTINE X 2)     Status: None   Collection Time    12/19/13  3:05 AM      Result Value Range Status   Specimen Description BLOOD LEFT HAND   Final   Special Requests BOTTLES DRAWN AEROBIC ONLY 5CC   Final    Culture  Setup Time     Final   Value: 12/19/2013 10:00     Performed at [facility]   Culture     Final   Value:        BLOOD CULTURE RECEIVED NO GROWTH TO DATE CULTURE WILL BE HELD FOR 5 DAYS BEFORE ISSUING A FINAL NEGATIVE REPORT     Performed at [facility]   Report Status PENDING   Incomplete  CULTURE, BLOOD (ROUTINE X 2)     Status: None   Collection Time    12/19/13  3:16 AM      Result Value Range Status   Specimen Description BLOOD RIGHT ARM   Final   Special Requests BOTTLES DRAWN AEROBIC ONLY 10CC   Final   Culture  Setup Time     Final   Value: 12/19/2013 10:00     Performed at [facility]   Culture     Final   Value:        BLOOD CULTURE RECEIVED NO GROWTH TO DATE CULTURE WILL BE HELD FOR 5 DAYS BEFORE ISSUING A FINAL NEGATIVE REPORT     Performed at [facility]   Report Status PENDING   Incomplete  BODY FLUID CULTURE     Status: None   Collection Time    12/20/13 10:15 AM      Result Value Range Status   Specimen Description ASCITIC FLUID ABDOMEN   Final   Special Requests NONE   Final   Gram Stain     Final   Value: MODERATE WBC PRESENT,BOTH PMN AND MONONUCLEAR     NO ORGANISMS SEEN     Performed at [facility]  Culture PENDING   Incomplete   Report Status PENDING   Incomplete   Studies/Results: US Paracentesis  12/20/2013   CLINICAL DATA:  Abdominal mass with probable hemorrhage, ascites. Request diagnostic paracentesis.  EXAM: ULTRASOUND GUIDED PARACENTESIS  COMPARISON:  None.  PROCEDURE: An ultrasound guided paracentesis was thoroughly discussed with the patient and questions answered. The benefits, risks, alternatives and complications were also discussed. The patient understands and wishes to proceed with the procedure. Written consent was obtained.  Ultrasound was performed to localize and mark an adequate pocket of fluid in the right lower quadrant of the abdomen. The area was then prepped and draped in the normal  sterile fashion. 1% Lidocaine was used for local anesthesia. Under ultrasound guidance a 19 gauge Yueh catheter was introduced. Paracentesis was performed. The catheter was removed and a dressing applied.  COMPLICATIONS: None immediate  FINDINGS: A total of approximately 270 mL of bloody ascitic fluid was removed. A fluid sample was sent for laboratory analysis.  IMPRESSION: Successful ultrasound guided paracentesis yielding 270 mL of ascites.  Read by: Brayton El PA-C   Electronically Signed   By: Maryclare Bean M.D.   On: 12/20/2013 10:16   Ct Angio Abd/pel W/ And/or W/o  12/19/2013   CLINICAL DATA:  Anemia and left pelvic hematoma. CTA assessment for possible vascular etiology.  EXAM: CT ANGIOGRAPHY ABDOMEN AND PELVIS WITH CONTRAST AND WITHOUT CONTRAST  TECHNIQUE: Multidetector CT imaging of the abdomen and pelvis was performed using the standard protocol during bolus administration of intravenous contrast. Multiplanar reconstructed images including MIPs were obtained and reviewed to evaluate the vascular anatomy.  CONTRAST:  OMNIPAQUE IOHEXOL 350 MG/ML SOLN  COMPARISON:  Unenhanced CT of the abdomen and pelvis on 12/18/2013.  FINDINGS: Size of the area of mixed density in the left pelvis is stable, measuring just under 13 cm in greatest diameter. During arterial phase imaging, no extravasation of contrast material or abnormal blood vessel is identified in this region. The soft tissue abnormality continues to demonstrate irregular mixed attenuation. A moderate amount of lower density ascites is also again identified throughout the peritoneal cavity.  The left pelvic abnormality is somewhat atypical in appearance for a simple hematoma and needs to be followed/ further assessed. There are some amorphous bowel loops that extend through this region and the presence of ascites and stranding in the mesentery may also be suggestive of an underlying neoplastic process. The dominant abnormality may be a bleeding  tumor rather than a simple spontaneous bleed.  Vascular assessment demonstrates diffuse atherosclerotic calcification of the abdominal aorta and iliac arteries. Major branch vessels show no significant stenosis of the celiac axis, superior mesenteric artery, renal arteries or inferior mesenteric artery. Diffuse calcification of the iliac arteries present with diffuse disease present but no significant occlusion or evidence of aneurysm. The common femoral arteries are diffusely calcified and diseased bilaterally.  No visible overtly enlarged lymph nodes are identified. There is no evidence of bowel obstruction or perforation. The liver shows no overt cirrhotic changes. The spleen shows irregular enhancement, which may be a normal perfusion pattern in the arterial phase. Both kidneys are unremarkable. No obvious pancreatic lesions are seen. There is somewhat limited evaluation of abdominal contents as the arms and hands overly the body, creating streak artifact. Bony structures show osteopenia without evidence of lesion or fracture.  The visualized lung bases show a small right pleural effusion with right basilar atelectasis. The bladder is decompressed by a Foley catheter. No hernias are seen. The  uterus appears to have been removed.  Review of the MIP images confirms the above findings.  IMPRESSION: No arterial contrast extravasation or abnormal blood vessel is identified in the region of the left pelvic abnormality. Based on its unusual mixed density appearance as well as a moderate amount of ascites in the peritoneal cavity, an underlying neoplastic process cannot be excluded. When better able, there may be a benefit in performing a followup CT of the abdomen and pelvis with oral contrast and during a venous phase of imaging.   Electronically Signed   By: Irish Lack M.D.   On: 12/19/2013 18:21   Medications: I have reviewed the patient's current medications. Scheduled Meds: . sodium chloride  500 mL  Intravenous Once  . valproate sodium  250 mg Intravenous Q12H   Continuous Infusions: . sodium chloride 20 mL/hr at 12/20/13 1149   PRN Meds:.bisacodyl, haloperidol, LORazepam, morphine injection Assessment/Plan:  # Acute blood loss Anemia- CTA abdomen was done yesterday- Revealed-an unusual mixed density appearance, as well as a moderate amount of ascite in the peritoneal cavity, an underlying neoplastic process cannot be excluded. Initial abd/pelvis Ct findings-12/18/2013 in the abdomen with a large acute intraabd bleed-?intra or extra abd hematoma. Patient was on Aspirin and Clopidogrel for CAD prior to fall. Hgb has been stable- 9.1- 10.4 throughout admission, blood pressure stable. Exam with signs of peritoneal irritation likely cause of her dyspnea. Ultra sound guided Paracentesis was done today with drainage of of bloody ascitic fliud. - Palliative consulted, patient is full comfort care. - Cont with oxygen supplementation  - No further investigations or aggressive interventions.  # Comfort care- Full comfort care. Palliative consulted, Recommendations appreciated. Will follow palliative recommendations. - Daughter said she will want her mother to have some form of nutrition by IV. Talked to palliative care, okay with slow infusion of D5 1/2 Ns at a slow rate of 15mls/hr for now. Palliative will see patient today, possibly talk to family, and will determine if patient should have more fluids, or fluids should be stopped.  Dementia with intermittent delirium- Present for the past 3 years as per pts son.  DVT PPX- SCDS   Dispo: Disposition is deferred at this time, awaiting improvement of current medical problems.    The patient does have a current PCP Richardean Chimera, MD) and does not need an [facility] hospital follow-up appointment after discharge.  The patient does not know have transportation limitations that hinder transportation to clinic appointments.  .Services Needed at time  of discharge: Y = Yes, Blank = No PT:   OT:   RN:   Equipment:   Other:     LOS: 4 days   Kennis Carina, MD 12/15/2013, 12:35 PM

## 2013-12-26 NOTE — Progress Notes (Signed)
Clinical Child psychotherapist (CSW) sent residential hospice referral to [facility] hospice home. Hansel Starling, RN at hospice home reported that they do have a bed available and they can accept the patient tomorrow on Sunday 01-11-14. CSW made patient's family aware of this including patient's daughter Ulis Rias. Patient's family agreeable to transition to residential hospice in [place]. CSW made MD and RN aware of above. CSW will assist with D/C tomorrow to [facility].   Jetta Lout, LCSWA Weekend CSW [phone]

## 2013-12-26 NOTE — Progress Notes (Addendum)
Internal Medicine Attending  Date: 01/16/2014  Patient name: [name] Medical record number: [id number] Date of birth: [birth year] Age: 80 y.o. Gender: female  I saw and evaluated the patient and discussed her care with house staff.  Patient is unresponsive this morning, in no apparent distress; her daughter is at bedside.  Her daughter voiced a desire for her mother to have IV fluid and I spoke with her at some length about treatment options.  I explained that IV fluid is a temporary measure which could not be continued when patient is discharged from the hospital, and I discussed with her options for nutrition if desired by patient and/or family.  I also discussed other treatments including blood transfusions, antibiotics, and monitoring of labs.  Her daughter does not wish to change the focus on comfort care; she explicitly does not want a feeding tube, does not want to continue blood draws, and would not want antibiotic treatments.  She still wants to pursue hospice placement.  However, she would feel more comfortable if we administered IV fluid while the patient is here.  I think it is reasonable to continue a low rate of IV fluid so long as it is not creating respiratory problems while patient is here.  Her prognosis is very poor.  Anticipate discharge to hospice when bed is available.  Dr. Rogelia Boga will take over as attending physician tomorrow 12/19/2013.

## 2013-12-26 DEATH — deceased

## 2014-04-24 IMAGING — CT CT CTA ABD/PEL W/CM AND/OR W/O CM
2 of 8 series · 15 of 46 positions shown, 17 images · IV contrast (APPLIED)
Comparison: Unenhanced CT of the abdomen and pelvis on 12/18/2013.

CLINICAL DATA: Anemia and left pelvic hematoma. CTA assessment for
possible vascular etiology.

EXAM:
CT ANGIOGRAPHY ABDOMEN AND PELVIS WITH CONTRAST AND WITHOUT CONTRAST
TECHNIQUE: Multidetector CT imaging of the abdomen and pelvis was performed
using the standard protocol during bolus administration of
intravenous contrast. Multiplanar reconstructed images including
MIPs were obtained and reviewed to evaluate the vascular anatomy.
CONTRAST:  100mL OMNIPAQUE IOHEXOL 350 MG/ML SOLN

[Series 4: dissection 2.0 i30f 3 · axial · 0.78mm/px · z∈[-507,-81]mm · 12 of 235 slices shown, 14 images]
[im 11/235  soft-tissue]
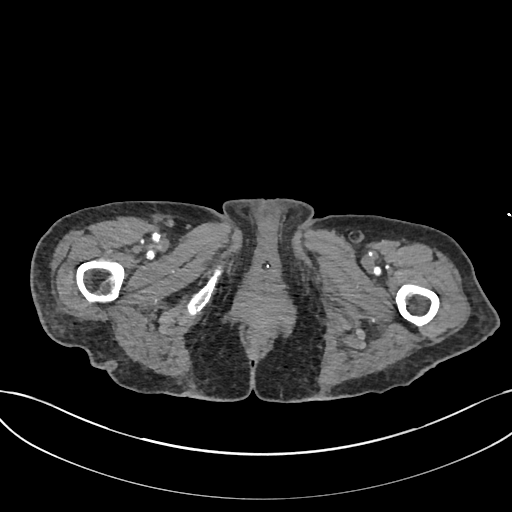
[im 11/235  bone]
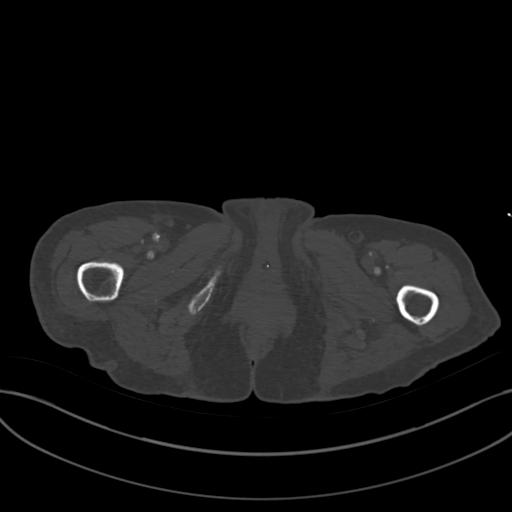
[im 32/235  soft-tissue]
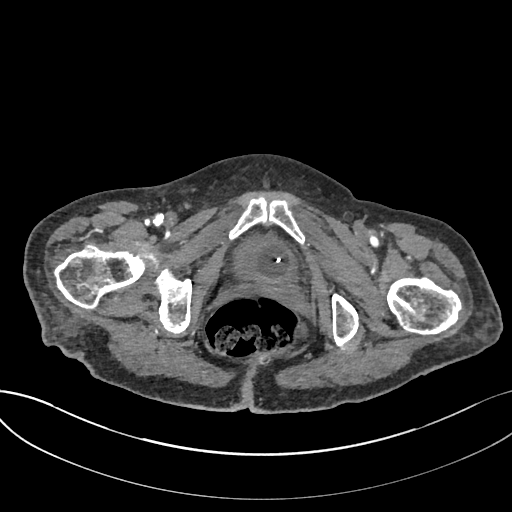
[im 54/235  soft-tissue]
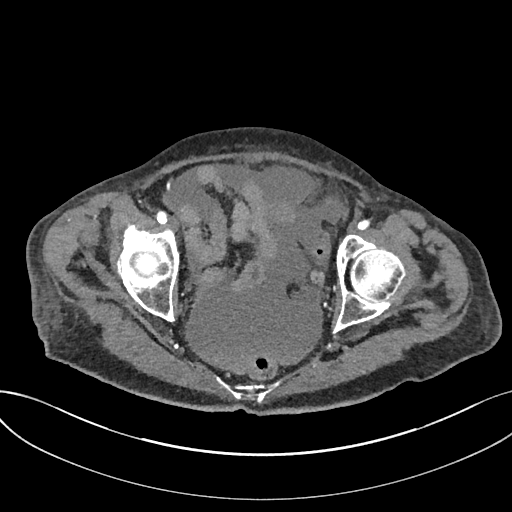
[im 75/235  soft-tissue]
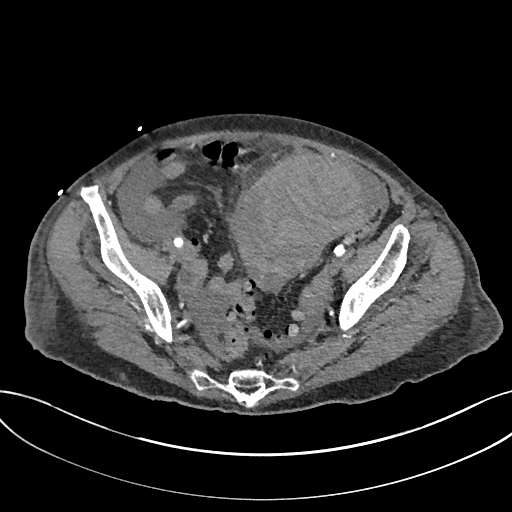
[im 86/235  soft-tissue]
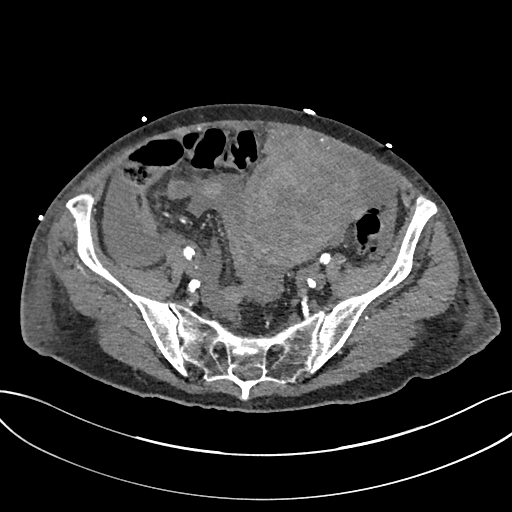
[im 107/235  soft-tissue]
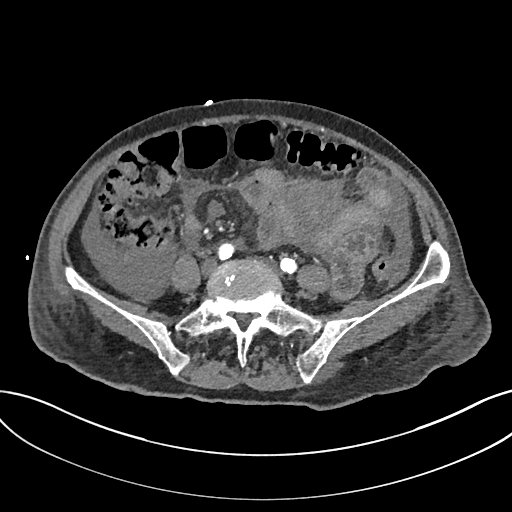
[im 128/235  soft-tissue]
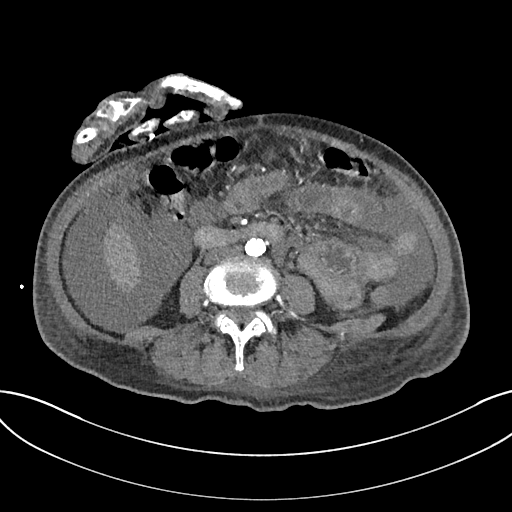
[im 149/235  soft-tissue]
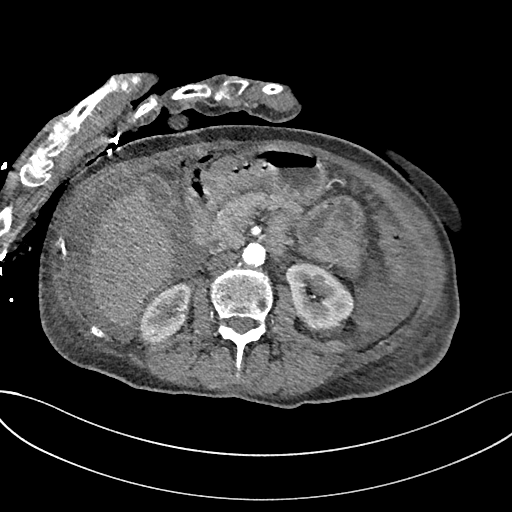
[im 160/235  soft-tissue]
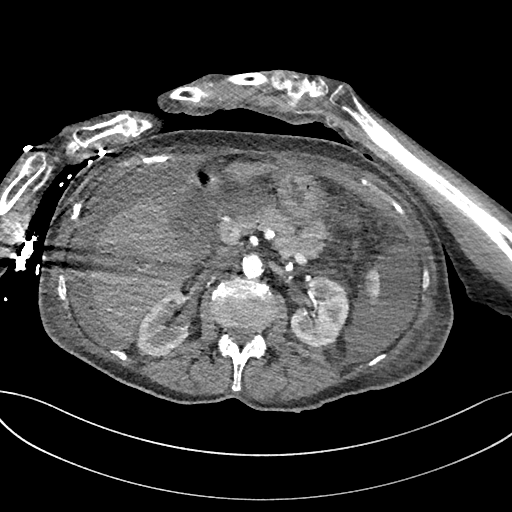
[im 160/235  bone]
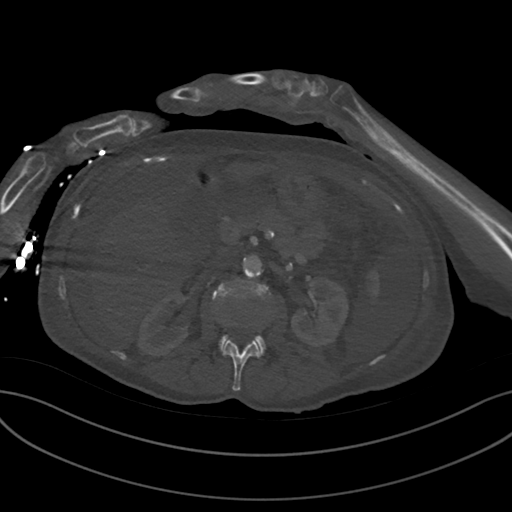
[im 181/235  soft-tissue]
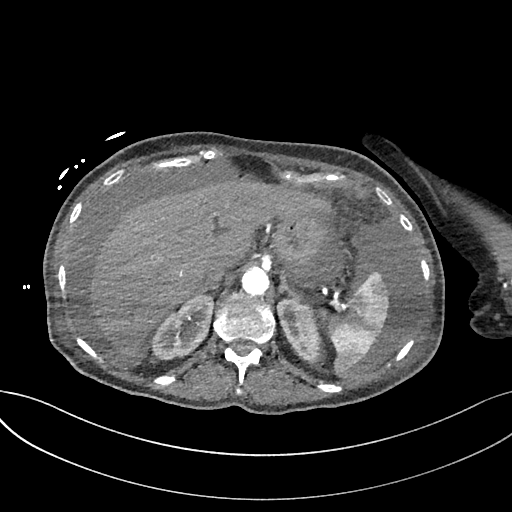
[im 203/235  soft-tissue]
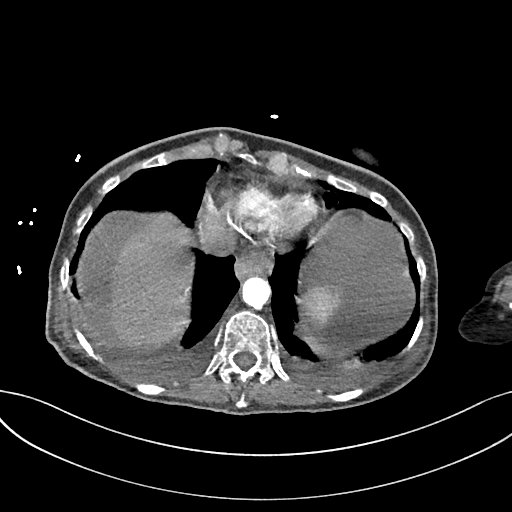
[im 224/235  soft-tissue]
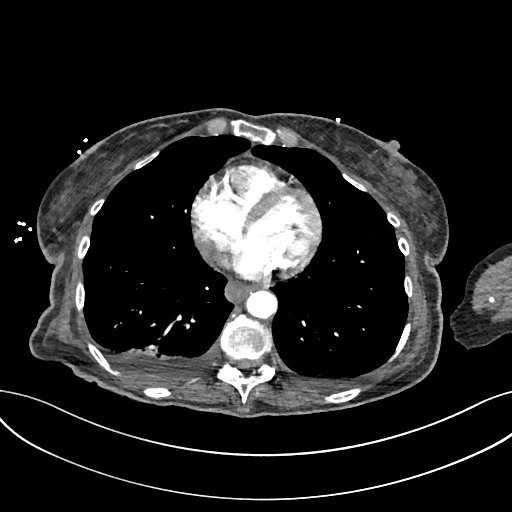

[Series 6: coronal mpr · coronal · 0.79mm/px · 3 of 132 slices shown]
[im 33/132  soft-tissue]
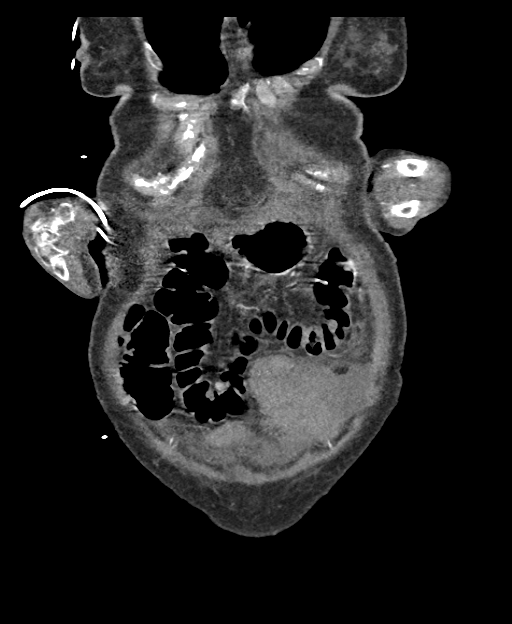
[im 66/132  soft-tissue]
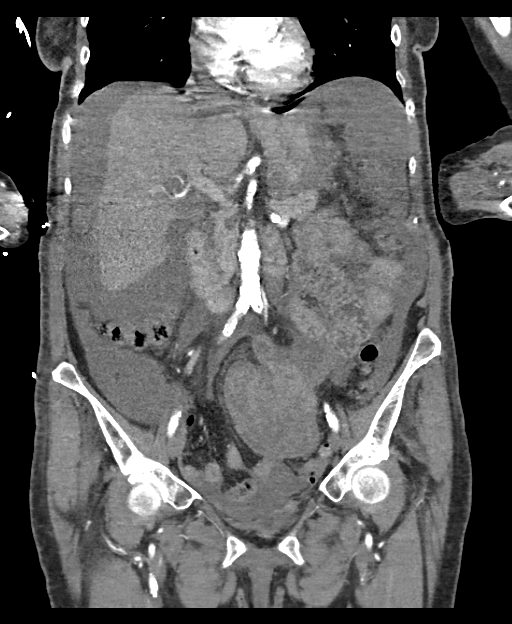
[im 99/132  soft-tissue]
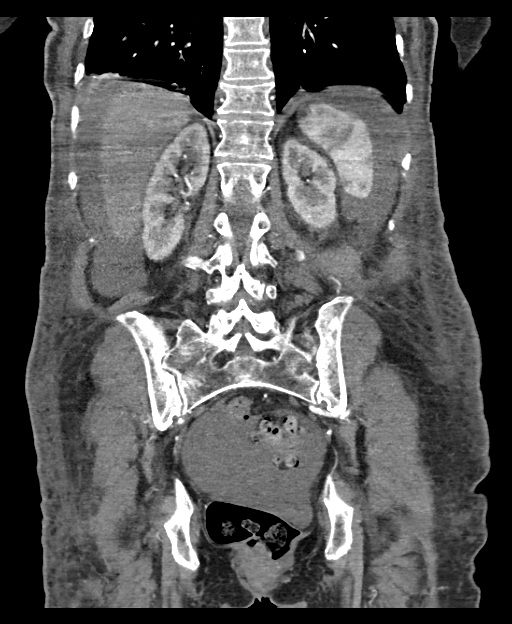

[15 of 46 positions shown; findings below may reference images not displayed]

FINDINGS: Size of the area of mixed density in the left pelvis is stable,
measuring just under 13 cm in greatest diameter. During arterial
phase imaging, no extravasation of contrast material or abnormal
blood vessel is identified in this region. The soft tissue
abnormality continues to demonstrate irregular mixed attenuation. A
moderate amount of lower density ascites is also again identified
throughout the peritoneal cavity.

The left pelvic abnormality is somewhat atypical in appearance for a
simple hematoma and needs to be followed/ further assessed. There
are some amorphous bowel loops that extend through this region and
the presence of ascites and stranding in the mesentery may also be
suggestive of an underlying neoplastic process. The dominant
abnormality may be a bleeding tumor rather than a simple spontaneous
bleed.

Vascular assessment demonstrates diffuse atherosclerotic
calcification of the abdominal aorta and iliac arteries. Major
branch vessels show no significant stenosis of the celiac axis,
superior mesenteric artery, renal arteries or inferior mesenteric
artery. Diffuse calcification of the iliac arteries present with
diffuse disease present but no significant occlusion or evidence of
aneurysm. The common femoral arteries are diffusely calcified and
diseased bilaterally.

No visible overtly enlarged lymph nodes are identified. There is no
evidence of bowel obstruction or perforation. The liver shows no
overt cirrhotic changes. The spleen shows irregular enhancement,
which may be a normal perfusion pattern in the arterial phase. Both
kidneys are unremarkable. No obvious pancreatic lesions are seen.
There is somewhat limited evaluation of abdominal contents as the
arms and hands overly the body, creating streak artifact. Bony
structures show osteopenia without evidence of lesion or fracture.

The visualized lung bases show a small right pleural effusion with
right basilar atelectasis. The bladder is decompressed by a Foley
catheter. No hernias are seen. The uterus appears to have been
removed.

Review of the MIP images confirms the above findings.
IMPRESSION: No arterial contrast extravasation or abnormal blood vessel is
identified in the region of the left pelvic abnormality. Based on
its unusual mixed density appearance as well as a moderate amount of
ascites in the peritoneal cavity, an underlying neoplastic process
cannot be excluded. When better able, there may be a benefit in
performing a followup CT of the abdomen and pelvis with oral
contrast and during a venous phase of imaging.

## 2014-04-25 IMAGING — US US PARACENTESIS
1 series · 9 of 9 positions shown · non-contrast
Comparison: None.

CLINICAL DATA: Abdominal mass with probable hemorrhage, ascites.
Request diagnostic paracentesis.

EXAM:
ULTRASOUND GUIDED PARACENTESIS

[Series 1: us paracentesis · 0.27mm/px · 9 of 9 slices shown]
[im 1/9]
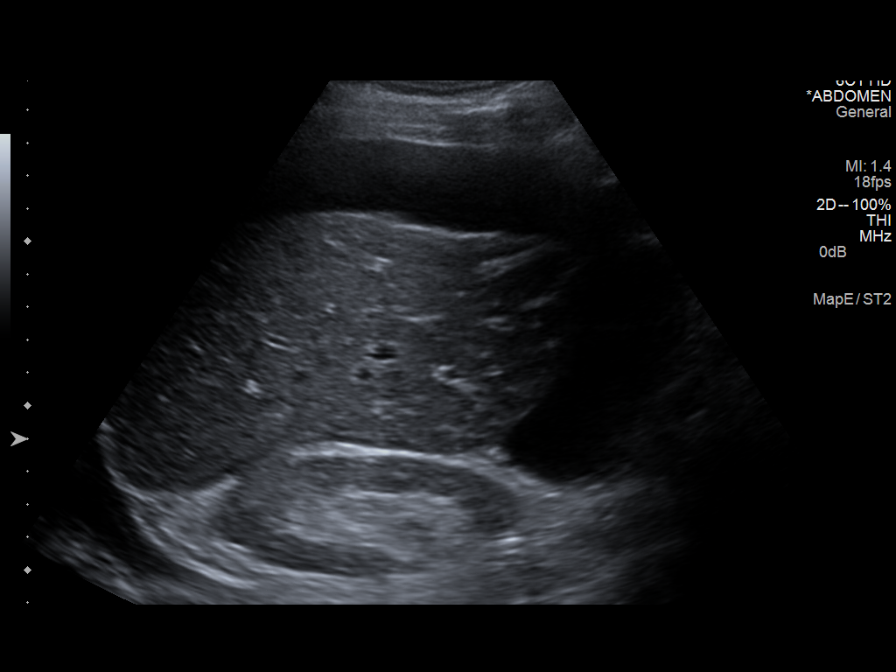
[im 2/9]
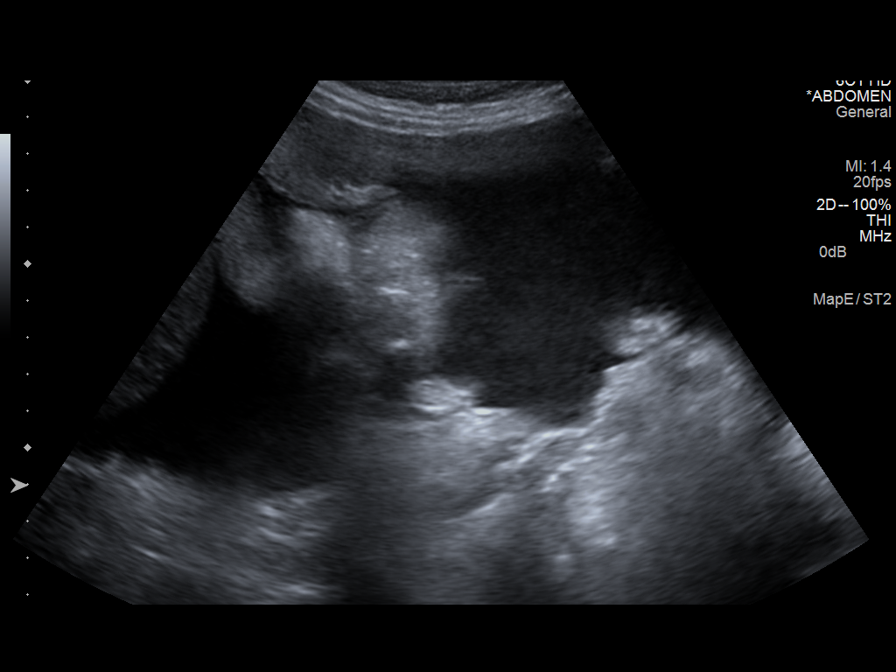
[im 3/9]
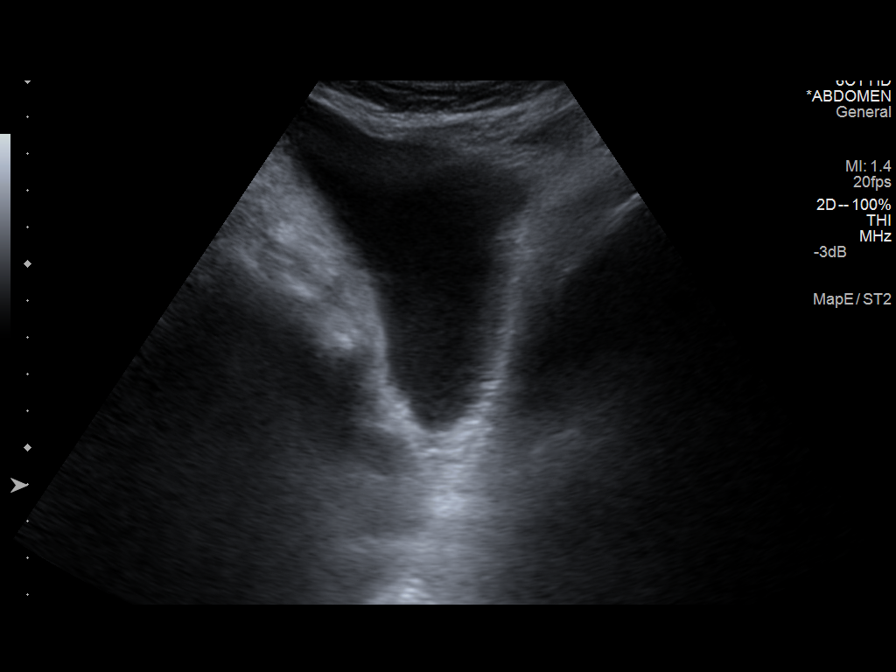
[im 4/9]
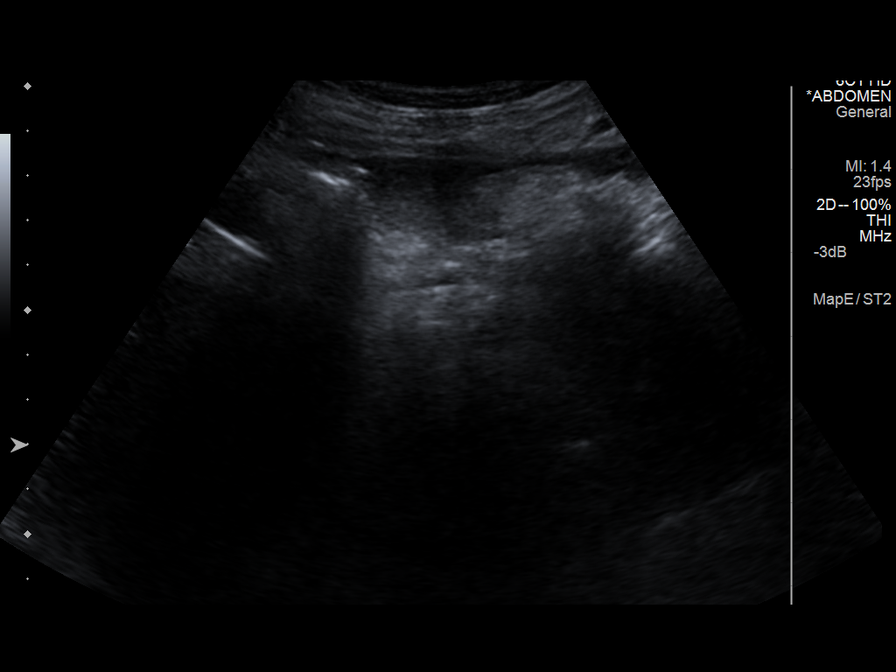
[im 5/9]
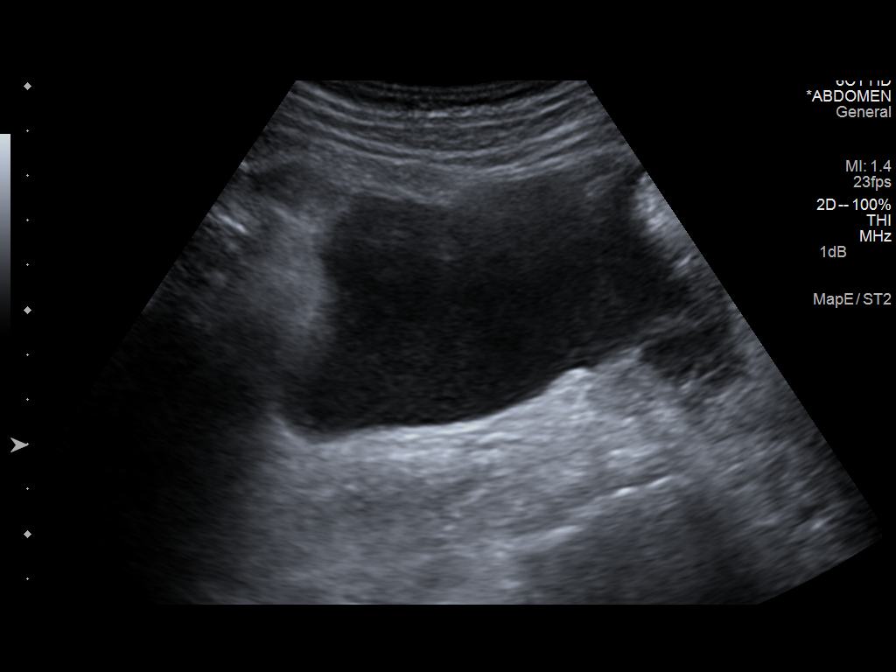
[im 6/9]
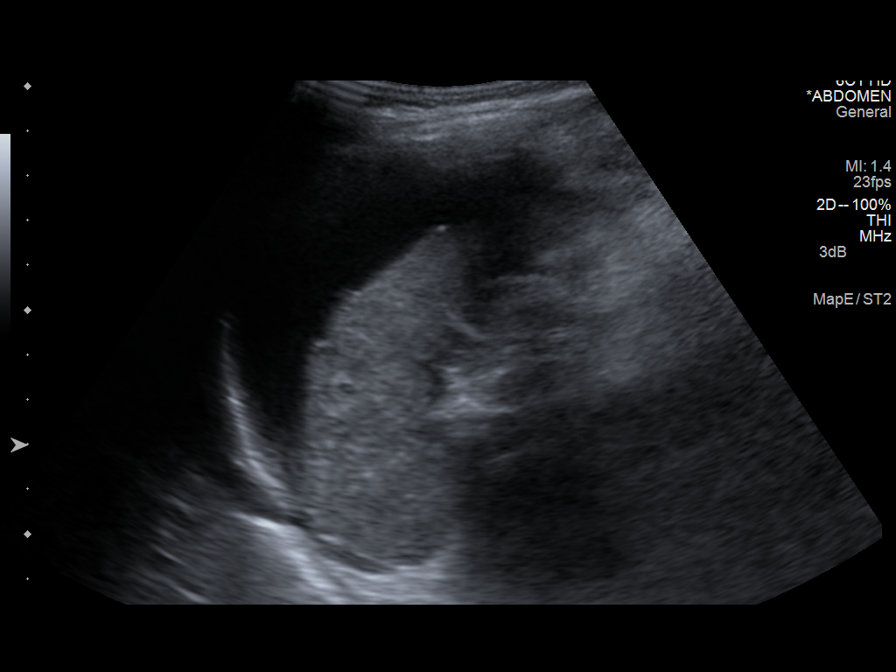
[im 7/9]
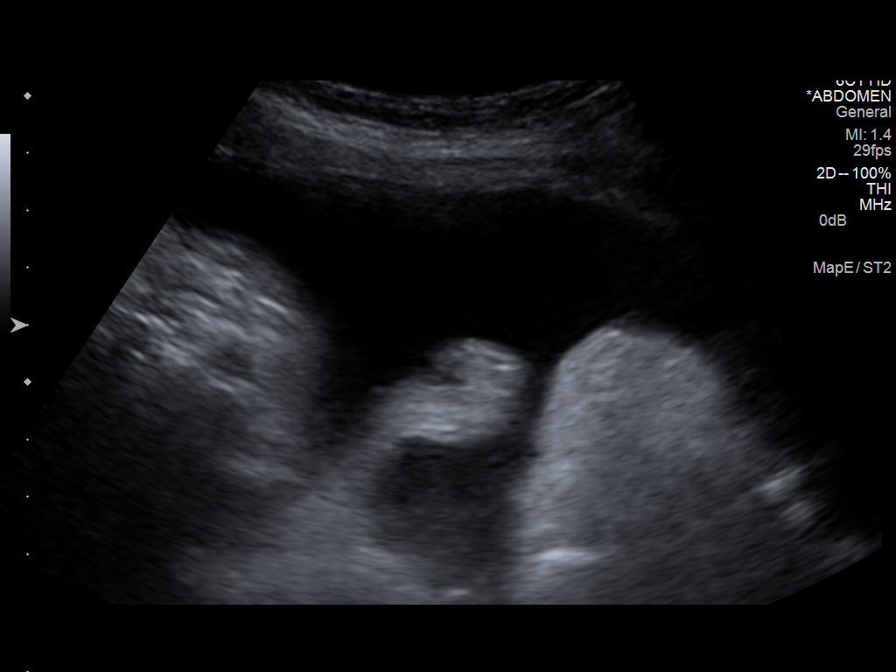
[im 8/9]
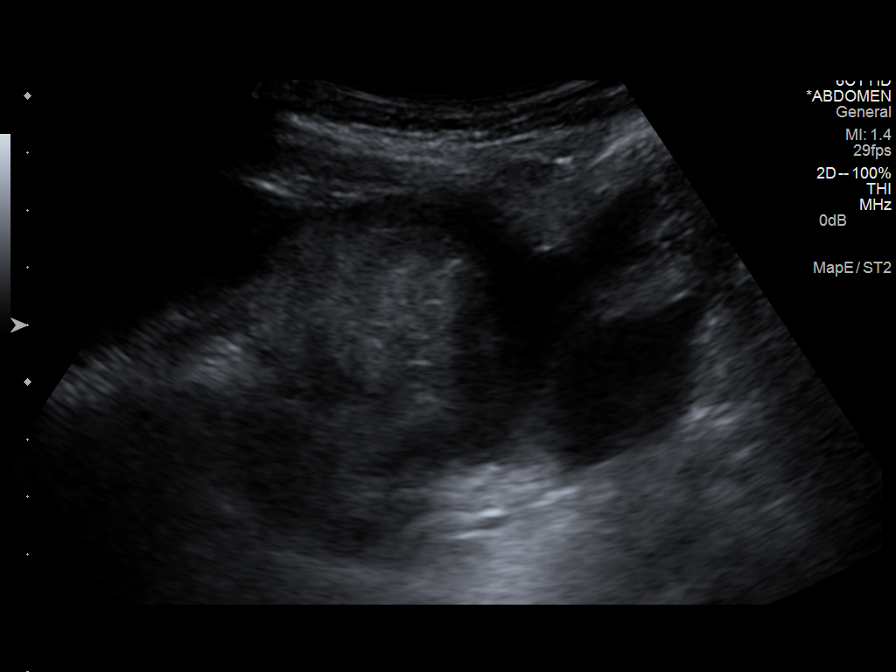
[im 9/9]
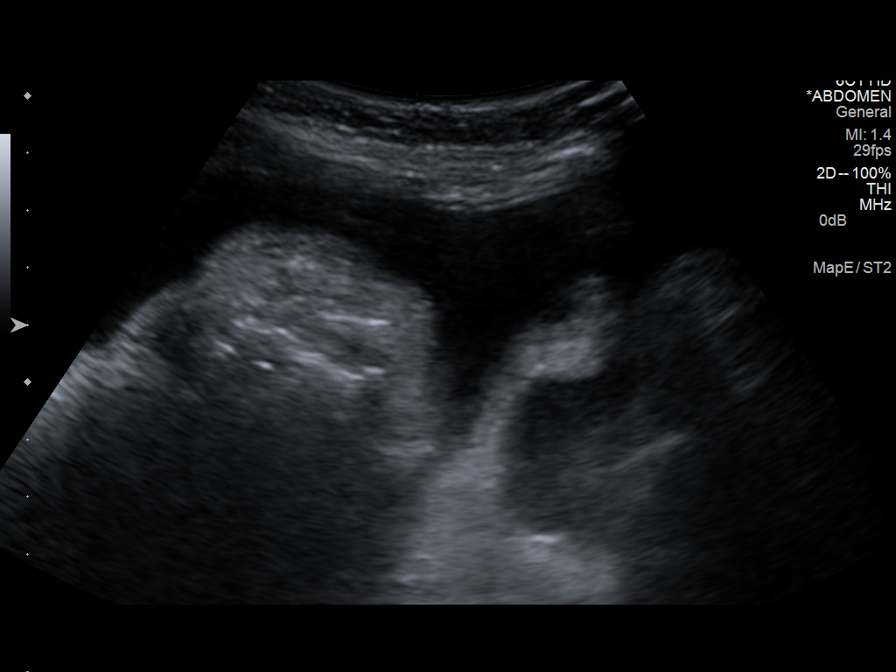

[9 of 9 positions shown; findings below may reference images not displayed]

PROCEDURE:
An ultrasound guided paracentesis was thoroughly discussed with the
patient and questions answered. The benefits, risks, alternatives
and complications were also discussed. The patient understands and
wishes to proceed with the procedure. Written consent was obtained.

Ultrasound was performed to localize and mark an adequate pocket of
fluid in the right lower quadrant of the abdomen. The area was then
prepped and draped in the normal sterile fashion. 1% Lidocaine was
used for local anesthesia. Under ultrasound guidance a 19 gauge Yueh
catheter was introduced. Paracentesis was performed. The catheter
was removed and a dressing applied.

COMPLICATIONS:
None immediate
FINDINGS: A total of approximately 270 mL of bloody ascitic fluid was removed.
A fluid sample was sent for laboratory analysis.
IMPRESSION: Successful ultrasound guided paracentesis yielding 270 mL of
ascites.
